# Patient Record
Sex: Male | Born: 1970 | Race: Black or African American | Hispanic: No | Marital: Single | Smoking: Former smoker
Health system: Southern US, Community
[De-identification: ages and names within clinical notes are randomized; demographics above are authoritative.]

## PROBLEM LIST (undated history)

## (undated) DIAGNOSIS — T782XXA Anaphylactic shock, unspecified, initial encounter: Secondary | ICD-10-CM

## (undated) DIAGNOSIS — R059 Cough, unspecified: Secondary | ICD-10-CM

## (undated) DIAGNOSIS — R0602 Shortness of breath: Secondary | ICD-10-CM

## (undated) DIAGNOSIS — K219 Gastro-esophageal reflux disease without esophagitis: Secondary | ICD-10-CM

## (undated) DIAGNOSIS — T7840XA Allergy, unspecified, initial encounter: Secondary | ICD-10-CM

## (undated) DIAGNOSIS — J4540 Moderate persistent asthma, uncomplicated: Secondary | ICD-10-CM

## (undated) DIAGNOSIS — R05 Cough: Secondary | ICD-10-CM

## (undated) HISTORY — DX: Cough, unspecified: R05.9

## (undated) HISTORY — DX: Allergy, unspecified, initial encounter: T78.40XA

## (undated) HISTORY — PX: OTHER SURGICAL HISTORY: SHX169

## (undated) HISTORY — DX: Gastro-esophageal reflux disease without esophagitis: K21.9

## (undated) HISTORY — DX: Shortness of breath: R06.02

## (undated) HISTORY — DX: Moderate persistent asthma, uncomplicated: J45.40

## (undated) HISTORY — DX: Cough: R05

## (undated) HISTORY — DX: Anaphylactic shock, unspecified, initial encounter: T78.2XXA

---

## 1998-08-05 ENCOUNTER — Emergency Department (HOSPITAL_COMMUNITY): Admission: EM | Admit: 1998-08-05 | Discharge: 1998-08-05 | Payer: Self-pay | Admitting: Emergency Medicine

## 1998-08-11 ENCOUNTER — Emergency Department (HOSPITAL_COMMUNITY): Admission: EM | Admit: 1998-08-11 | Discharge: 1998-08-11 | Payer: Self-pay | Admitting: Emergency Medicine

## 2003-06-10 ENCOUNTER — Emergency Department (HOSPITAL_COMMUNITY): Admission: EM | Admit: 2003-06-10 | Discharge: 2003-06-10 | Payer: Self-pay | Admitting: Emergency Medicine

## 2004-08-09 ENCOUNTER — Emergency Department (HOSPITAL_COMMUNITY): Admission: EM | Admit: 2004-08-09 | Discharge: 2004-08-09 | Payer: Self-pay | Admitting: Emergency Medicine

## 2007-10-01 ENCOUNTER — Emergency Department (HOSPITAL_COMMUNITY): Admission: EM | Admit: 2007-10-01 | Discharge: 2007-10-01 | Payer: Self-pay | Admitting: Emergency Medicine

## 2009-08-13 ENCOUNTER — Encounter: Admission: RE | Admit: 2009-08-13 | Discharge: 2009-08-13 | Payer: Self-pay | Admitting: Internal Medicine

## 2011-10-24 ENCOUNTER — Emergency Department (HOSPITAL_COMMUNITY): Payer: No Typology Code available for payment source

## 2011-10-24 ENCOUNTER — Encounter (HOSPITAL_COMMUNITY): Payer: Self-pay | Admitting: Emergency Medicine

## 2011-10-24 ENCOUNTER — Emergency Department (HOSPITAL_COMMUNITY)
Admission: EM | Admit: 2011-10-24 | Discharge: 2011-10-24 | Disposition: A | Discharge status: Home / Self Care | Payer: No Typology Code available for payment source | Attending: Emergency Medicine | Admitting: Emergency Medicine

## 2011-10-24 DIAGNOSIS — R079 Chest pain, unspecified: Secondary | ICD-10-CM | POA: Insufficient documentation

## 2011-10-24 DIAGNOSIS — F17200 Nicotine dependence, unspecified, uncomplicated: Secondary | ICD-10-CM | POA: Insufficient documentation

## 2011-10-24 DIAGNOSIS — R0781 Pleurodynia: Secondary | ICD-10-CM

## 2011-10-24 MED ORDER — OXYCODONE-ACETAMINOPHEN 5-325 MG PO TABS: 1.0000 | ORAL_TABLET | Freq: Four times a day (QID) | ORAL | Status: AC | PRN

## 2011-10-24 MED ORDER — OXYCODONE-ACETAMINOPHEN 5-325 MG PO TABS
2.0000 | ORAL_TABLET | Freq: Once | ORAL | Status: AC
  Administered 2011-10-24: 2 via ORAL
  Filled 2011-10-24: qty 2

## 2011-10-24 MED ORDER — DIAZEPAM 5 MG PO TABS: 5.0000 mg | ORAL_TABLET | Freq: Two times a day (BID) | ORAL | Status: AC

## 2011-10-24 NOTE — ED Provider Notes (Signed)
Medical screening examination/treatment/procedure(s) were performed by non-physician practitioner and as supervising physician I was immediately available for consultation/collaboration.  Doug Sou, MD 10/24/11 1555
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
## 2015-07-02 ENCOUNTER — Emergency Department (HOSPITAL_COMMUNITY)
Admission: EM | Admit: 2015-07-02 | Discharge: 2015-07-03 | Disposition: A | Discharge status: Home / Self Care | Payer: Managed Care, Other (non HMO) | Attending: Emergency Medicine | Admitting: Emergency Medicine

## 2015-07-02 ENCOUNTER — Encounter (HOSPITAL_COMMUNITY): Payer: Self-pay

## 2015-07-02 DIAGNOSIS — Z79899 Other long term (current) drug therapy: Secondary | ICD-10-CM | POA: Diagnosis not present

## 2015-07-02 DIAGNOSIS — Y998 Other external cause status: Secondary | ICD-10-CM | POA: Insufficient documentation

## 2015-07-02 DIAGNOSIS — T63481A Toxic effect of venom of other arthropod, accidental (unintentional), initial encounter: Secondary | ICD-10-CM | POA: Insufficient documentation

## 2015-07-02 DIAGNOSIS — K029 Dental caries, unspecified: Secondary | ICD-10-CM | POA: Diagnosis not present

## 2015-07-02 DIAGNOSIS — Z792 Long term (current) use of antibiotics: Secondary | ICD-10-CM | POA: Insufficient documentation

## 2015-07-02 DIAGNOSIS — F17200 Nicotine dependence, unspecified, uncomplicated: Secondary | ICD-10-CM | POA: Diagnosis not present

## 2015-07-02 DIAGNOSIS — Y9389 Activity, other specified: Secondary | ICD-10-CM | POA: Insufficient documentation

## 2015-07-02 DIAGNOSIS — K002 Abnormalities of size and form of teeth: Secondary | ICD-10-CM | POA: Diagnosis not present

## 2015-07-02 DIAGNOSIS — L509 Urticaria, unspecified: Secondary | ICD-10-CM | POA: Diagnosis not present

## 2015-07-02 DIAGNOSIS — R062 Wheezing: Secondary | ICD-10-CM | POA: Insufficient documentation

## 2015-07-02 DIAGNOSIS — Y9289 Other specified places as the place of occurrence of the external cause: Secondary | ICD-10-CM | POA: Insufficient documentation

## 2015-07-02 LAB — BASIC METABOLIC PANEL
ANION GAP: 11 (ref 5–15)
BUN: 7 mg/dL (ref 6–20)
CO2: 20 mmol/L — ABNORMAL LOW (ref 22–32)
Calcium: 8.2 mg/dL — ABNORMAL LOW (ref 8.9–10.3)
Chloride: 102 mmol/L (ref 101–111)
Creatinine, Ser: 1.01 mg/dL (ref 0.61–1.24)
GFR calc Af Amer: >60 mL/min (ref >60)
GLUCOSE: 104 mg/dL — AB (ref 65–99)
Potassium: 4.1 mmol/L (ref 3.5–5.1)
Sodium: 133 mmol/L — ABNORMAL LOW (ref 135–145)

## 2015-07-02 LAB — CBC WITH DIFFERENTIAL/PLATELET
BASOS ABS: 0 10*3/uL (ref 0.0–0.1)
Basophils Relative: 0 %
Eosinophils Absolute: 0 10*3/uL (ref 0.0–0.7)
Eosinophils Relative: 1 %
HEMATOCRIT: 47.5 % (ref 39.0–52.0)
HEMOGLOBIN: 16.1 g/dL (ref 13.0–17.0)
LYMPHS PCT: 18 %
Lymphs Abs: 0.8 10*3/uL (ref 0.7–4.0)
MCH: 30.9 pg (ref 26.0–34.0)
MCHC: 33.9 g/dL (ref 30.0–36.0)
MCV: 91.2 fL (ref 78.0–100.0)
MONO ABS: 0.5 10*3/uL (ref 0.1–1.0)
Monocytes Relative: 13 %
NEUTROS ABS: 3 10*3/uL (ref 1.7–7.7)
NEUTROS PCT: 68 %
Platelets: 184 10*3/uL (ref 150–400)
RBC: 5.21 MIL/uL (ref 4.22–5.81)
RDW: 14.6 % (ref 11.5–15.5)
WBC: 4.3 10*3/uL (ref 4.0–10.5)

## 2015-07-02 MED ORDER — METHYLPREDNISOLONE SODIUM SUCC 125 MG IJ SOLR
125.0000 mg | Freq: Once | INTRAMUSCULAR | Status: AC
  Administered 2015-07-02: 125 mg via INTRAVENOUS
  Filled 2015-07-02: qty 2

## 2015-07-02 MED ORDER — DIPHENHYDRAMINE HCL 50 MG/ML IJ SOLN
25.0000 mg | Freq: Once | INTRAMUSCULAR | Status: AC
  Administered 2015-07-02: 25 mg via INTRAVENOUS
  Filled 2015-07-02: qty 1

## 2015-07-02 MED ORDER — FAMOTIDINE IN NACL 20-0.9 MG/50ML-% IV SOLN
20.0000 mg | Freq: Once | INTRAVENOUS | Status: AC
  Administered 2015-07-02: 20 mg via INTRAVENOUS
  Filled 2015-07-02: qty 50

## 2015-07-02 MED ORDER — SODIUM CHLORIDE 0.9 % IV BOLUS (SEPSIS)
1000.0000 mL | Freq: Once | INTRAVENOUS | Status: AC
  Administered 2015-07-02: 1000 mL via INTRAVENOUS

## 2015-07-02 NOTE — ED Provider Notes (Signed)
CSN: [id number]     Arrival date & time 07/02/15  2036 History   First MD Initiated Contact with Patient 07/02/15 2127     Chief Complaint  Patient presents with  . Urticaria  . Insect Bite     KEENON LEITZEL is a 45 y.o. male who presents to the ED after a possible insect bite to his right hand. 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 He did not get a good look at what bit him.  He reports he started to notice pain, swelling and itching that is traveling up his arm. He complains of swelling and pain to his hand. He reports he had teeth pulled two days ago and was started on  Penicillin yesterday. He denies any known allergies to any antibiotics. He denies any known exposures or known allergies to insects. He denies feeling short of breath. He has never had a reaction like this before. He denies fevers, trouble swallowing, trouble breathing, The history is provided by the patient. No language interpreter was used.    History reviewed. No pertinent past medical history. History reviewed. No pertinent past surgical history. No family history on file. Social History  Substance Use Topics  . Smoking status: Current Some Day Smoker  . Smokeless tobacco: None  . Alcohol Use: Yes    Review of Systems  Constitutional: Negative for fever and chills.  HENT: Negative for congestion and sore throat.   Eyes: Negative for visual disturbance.  Respiratory: Negative for cough, shortness of breath and wheezing.   Cardiovascular: Negative for chest pain and palpitations.  Gastrointestinal: Negative for nausea, vomiting, abdominal pain and diarrhea.  Genitourinary: Negative for dysuria.  Musculoskeletal: Positive for joint swelling and arthralgias. Negative for back pain and neck pain.  Skin: Positive for rash and wound.  Neurological: Negative for syncope, light-headedness and headaches.       Allergies  Review of patient's allergies indicates no known allergies.  Home Medications   Prior to Admission medications   Medication Sig Start Date End Date Taking? Authorizing Provider  penicillin v potassium (VEETID) 500 MG tablet Take 500 mg by mouth 4 (four) times daily.   Yes Historical Provider, MD  cetirizine (ZYRTEC ALLERGY) 10 MG tablet Take 1 tablet (10 mg total) by mouth daily. 07/03/15   Everlene Farrier, PA-C  diphenhydrAMINE (BENADRYL) 25 MG tablet Take 1 tablet (25 mg total) by mouth every 6 (six) hours as needed for itching (Rash). 07/03/15   Everlene Farrier, PA-C  EPINEPHrine (EPIPEN 2-PAK) 0.3 mg/0.3 mL IJ SOAJ injection Inject 0.3 mLs (0.3 mg total) into the muscle once as needed (for severe allergic reaction). CAll 911 immediately if you have to use this medicine 07/03/15   Everlene Farrier, PA-C  predniSONE (DELTASONE) 20 MG tablet Take 3 tablets (60 mg total) daily for 3 days, then 2 tablets daily (40 mg total) for 3 days, then 1 tablet daily (20 mg total) for 3 days, then 1/2 tablet (10 mg total) daily for 2 days. 07/03/15   Everlene Farrier, PA-C   BP 122/75 mmHg  Pulse 91  Temp(Src) 99.8 F (37.7 C) (Oral)  Resp 16  SpO2 96% Physical Exam  Constitutional: He appears well-developed and well-nourished. No distress.   Nontoxic appearing.  HENT:  Head: Normocephalic and atraumatic.  Mouth/Throat: Oropharynx is clear and moist.   Generally poor dentition. Multiple dental caries. Oropharynx  is clear. Airway is patent.  Eyes: Conjunctivae are normal. Pupils are equal, round, and reactive to light. Right eye exhibits no discharge. Left eye exhibits no discharge.  Neck: Neck supple.  Cardiovascular: Normal rate, regular rhythm, normal heart sounds and intact distal pulses.  Exam reveals no gallop and no friction rub.   No murmur heard.  Heart rate is 96. Bilateral radial pulses are intact. Good capillary refill to his bilateral distal fingertips.  Pulmonary/Chest:  Effort normal. No respiratory distress. He has wheezes. He has no rales.   Slight scattered wheezes noted bilaterally. No increased work of breathing. Oxygen saturation 100% on room air.  Abdominal: Soft. Bowel sounds are normal. There is no tenderness. There is no guarding.  Musculoskeletal: He exhibits no edema.  Lymphadenopathy:    He has no cervical adenopathy.  Neurological: He is alert. Coordination normal.  Skin: Skin is warm and dry. No rash noted. He is not diaphoretic. There is erythema. No pallor.   Patient has erythema and edema noted to his right hand that is going up his arm. Patient has multiple areas of urticaria noted to his bilateral arms and thighs. Please see attached picture for details.  Psychiatric: He has a normal mood and affect. His behavior is normal.  Nursing note and vitals reviewed.       ED Course  Procedures (including critical care time) Labs Review Labs Reviewed  BASIC METABOLIC PANEL - Abnormal; Notable for the following:    Sodium 133 (*)    CO2 20 (*)    Glucose, Bld 104 (*)    Calcium 8.2 (*)    All other components within normal limits  CBC WITH DIFFERENTIAL/PLATELET    Imaging Review No results found. I have personally reviewed and evaluated these lab results as part of my medical decision-making.   EKG Interpretation None      Filed Vitals:   07/02/15 2345 07/03/15 0000 07/03/15 0015 07/03/15 0030  BP: 159/77 147/81 137/64 122/75  Pulse: 94 96 84 91  Temp:      TempSrc:      Resp:      SpO2: 97% 96% 96% 96%     MDM   Meds given in ED:  Medications  sodium chloride 0.9 % bolus 1,000 mL (0 mLs Intravenous Stopped 07/02/15 2317)  methylPREDNISolone sodium succinate (SOLU-MEDROL) 125 mg/2 mL injection 125 mg (125 mg Intravenous Given 07/02/15 2208)  famotidine (PEPCID) IVPB 20 mg premix (0 mg Intravenous Stopped 07/02/15 2244)  diphenhydrAMINE (BENADRYL) injection 25 mg (25 mg Intravenous Given 07/02/15 2208)  diphenhydrAMINE  (BENADRYL) injection 25 mg (25 mg Intravenous Given 07/02/15 2317)    Discharge Medication List as of 07/03/2015 12:52 AM    START taking these medications   Details  cetirizine (ZYRTEC ALLERGY) 10 MG tablet Take 1 tablet (10 mg total) by mouth daily., Starting 07/03/2015, Until Discontinued, Print    diphenhydrAMINE (BENADRYL) 25 MG tablet Take 1 tablet (25 mg total) by mouth every 6 (six) hours as needed for itching (Rash)., Starting 07/03/2015, Until Discontinued, Print    EPINEPHrine (EPIPEN 2-PAK) 0.3 mg/0.3 mL IJ SOAJ injection Inject 0.3 mLs (0.3 mg total) into the muscle once as needed (for severe allergic reaction). CAll 911 immediately if you have to use this medicine, Starting 07/03/2015, Until Discontinued, Print    predniSONE (DELTASONE) 20 MG tablet Take 3 tablets (60 mg total) daily for 3 days, then 2 tablets daily (40 mg total) for 3 days, then 1 tablet daily (20  mg total) for 3 days, then 1/2 tablet (10 mg total) daily for 2 days., Print         Final diagnoses:  Allergic reaction to insect sting, accidental or unintentional, initial encounter   This  is a 45 y.o. male who presents to the ED after a possible insect bite to his right hand. 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 He did not get a good look at what bit him.  He reports he started to notice pain, swelling and itching that is traveling up his arm. He complains of swelling and pain to his hand. He reports he had teeth pulled two days ago and was started on  Penicillin yesterday. He denies any known allergies to any antibiotics.   on exam the patient is afebrile nontoxic appearing. He is having an allergic reaction to this insect bite. Please see picture book for details. Hives to his upper and lower body. He denies any trouble breathing. He has no increased work of breathing. Oxygen saturation is 100% on room air.   patient is provided with Solu-Medrol, Benadryl, and Pepcid. Reevaluation patient reports feeling much better. His swelling has improved. He has had no trouble breathing. Is been monitored for several hours in the emergency department. We'll discharge with a prednisone taper, Benadryl, Zyrtec, and an EpiPen. I provided extensive education on how to use EpiPen and that he needs to call 911 if he uses his EpiPen.  I discussed her concerns specific return precautions. I advised the patient to follow-up with their primary care provider this week. I advised the patient to return to the emergency department with new or worsening symptoms or new concerns. The patient verbalized understanding and agreement with plan.    This patient was discussed with Dr. Lynelle DoctorKnapp who agrees with assessment and plan.   Everlene FarrierWilliam Idania Desouza, PA-C 07/03/15 [id number]  Linwood DibblesJon Knapp, MD 07/03/15 1228

## 2015-07-02 NOTE — ED Notes (Addendum)
Pt here for hives to patients arms and swelling to right hand that he noticed tonight at 8pm. He thinks he may have been bitten by a spider. Denies SOB.

## 2015-07-02 NOTE — ED Notes (Signed)
MD and charge RN aware of patients reaction to spider bite and pt to be taken to next available room.

## 2015-07-03 MED ORDER — DIPHENHYDRAMINE HCL 25 MG PO TABS: 25.0000 mg | ORAL_TABLET | Freq: Four times a day (QID) | ORAL | Status: DC | PRN

## 2015-07-03 MED ORDER — CETIRIZINE HCL 10 MG PO TABS: 10.0000 mg | ORAL_TABLET | Freq: Every day | ORAL | Status: DC

## 2015-07-03 MED ORDER — EPINEPHRINE 0.3 MG/0.3ML IJ SOAJ: 0.3000 mg | Freq: Once | INTRAMUSCULAR | Status: AC | PRN

## 2015-07-03 MED ORDER — PREDNISONE 20 MG PO TABS: ORAL_TABLET | ORAL | Status: DC

## 2015-07-03 NOTE — Discharge Instructions (Signed)
Hives Hives are itchy, red, swollen areas of the skin. They can vary in size and location on your body. Hives can come and go for hours or several days (acute hives) or for several weeks (chronic hives). Hives do not spread from person to person (noncontagious). They may get worse with scratching, exercise, and emotional stress. CAUSES   Allergic reaction to food, additives, or drugs.  Infections, including the common cold.  Illness, such as vasculitis, lupus, or thyroid disease.  Exposure to sunlight, heat, or cold.  Exercise.  Stress.  Contact with chemicals. SYMPTOMS   Red or white swollen patches on the skin. The patches may change size, shape, and location quickly and repeatedly.  Itching.  Swelling of the hands, feet, and face. This may occur if hives develop deeper in the skin. DIAGNOSIS  Your caregiver can usually tell what is wrong by performing a physical exam. Skin or blood tests may also be done to determine the cause of your hives. In some cases, the cause cannot be determined. TREATMENT  Mild cases usually get better with medicines such as antihistamines. Severe cases may require an emergency epinephrine injection. If the cause of your hives is known, treatment includes avoiding that trigger.  HOME CARE INSTRUCTIONS   Avoid causes that trigger your hives.  Take antihistamines as directed by your caregiver to reduce the severity of your hives. Non-sedating or low-sedating antihistamines are usually recommended. Do not drive while taking an antihistamine.  Take any other medicines prescribed for itching as directed by your caregiver.  Wear loose-fitting clothing.  Keep all follow-up appointments as directed by your caregiver. SEEK MEDICAL CARE IF:   You have persistent or severe itching that is not relieved with medicine.  You have painful or swollen joints. SEEK IMMEDIATE MEDICAL CARE IF:   You have a fever.  Your tongue or lips are swollen.  You have  trouble breathing or swallowing.  You feel tightness in the throat or chest.  You have abdominal pain. These problems may be the first sign of a life-threatening allergic reaction. Call your local emergency services (911 in [place] MAKE SURE YOU:   Understand these instructions.  Will watch your condition.  Will get help right away if you are not doing well or get worse.   This information is not intended to replace advice given to you by your health care provider. Make sure you discuss any questions you have with your health care provider.   Document Released: 04/04/2005 Document Revised: 04/09/2013 Document Reviewed: 06/28/2011 Elsevier Interactive Patient Education 2016 [employer]. Epinephrine injection (Auto-injector) What is this medicine? EPINEPHRINE (ep i NEF rin) is used for the emergency treatment of severe allergic reactions. You should keep this medicine with you at all times. This medicine may be used for other purposes; ask your health care provider or pharmacist if you have questions. What should I tell my health care provider before I take this medicine? They need to know if you have any of the following conditions: -diabetes -heart disease -high blood pressure -lung or breathing disease, like asthma -Parkinson's disease -thyroid disease -an unusual or allergic reaction to epinephrine, sulfites, other medicines, foods, dyes, or preservatives -pregnant or trying to get pregnant -breast-feeding How should I use this medicine? This medicine is for injection into the outer thigh. Your doctor or health care professional will instruct you on the proper use of the device during an emergency. Read all directions carefully and make sure you understand them. Do not use  more often than directed. Talk to your pediatrician regarding the use of this medicine in children. Special care may be needed. This drug is commonly used in children. A special device is available for use in  children. If you are giving this medicine to a young child, hold their leg firmly in place before and during the injection to prevent injury. Overdosage: If you think you have taken too much of this medicine contact a poison control center or emergency room at once. NOTE: This medicine is only for you. Do not share this medicine with others. What if I miss a dose? This does not apply. You should only use this medicine for an allergic reaction. What may interact with this medicine? This medicine is only used during an emergency. Significant drug interactions are not likely during emergency use. This list may not describe all possible interactions. Give your health care provider a list of all the medicines, herbs, non-prescription drugs, or dietary supplements you use. Also tell them if you smoke, drink alcohol, or use illegal drugs. Some items may interact with your medicine. What should I watch for while using this medicine? Keep this medicine ready for use in the case of a severe allergic reaction. Make sure that you have the phone number of your doctor or health care professional and local hospital ready. Remember to check the expiration date of your medicine regularly. You may need to have additional units of this medicine with you at work, school, or other places. Talk to your doctor or health care professional about your need for extra units. Some emergencies may require an additional dose. Check with your doctor or a health care professional before using an extra dose. After use, go to the nearest hospital or call 911. Avoid physical activity. Make sure the treating health care professional knows you have received an injection of this medicine. You will receive additional instructions on what to do during and after use of this medicine before a medical emergency occurs. What side effects may I notice from receiving this medicine? Side effects that you should report to your doctor or health care  professional as soon as possible: -allergic reactions like skin rash, itching or hives, swelling of the face, lips, or tongue -breathing problems -chest pain -fast, irregular heartbeat -pain, tingling, numbness in the hands or feet -pain, redness, or irritation at site where injected -vomiting Side effects that usually do not require medical attention (report to your doctor or health care professional if they continue or are bothersome): -anxious -dizziness -dry mouth -headache -increased sweating -nausea -unusually weak or tired This list may not describe all possible side effects. Call your doctor for medical advice about side effects. You may report side effects to FDA at 1-800-FDA-1088. Where should I keep my medicine? Keep out of the reach of children. Store at room temperature between 15 and 30 degrees C (59 and 86 degrees F). Protect from light and heat. The solution should be clear in color. If the solution is discolored or contains particles it must be replaced. Throw away any unused medicine after the expiration date. Ask your doctor or pharmacist about proper disposal of the injector if it is expired or has been used. Always replace your auto-injector before it expires. NOTE: This sheet is a summary. It may not cover all possible information. If you have questions about this medicine, talk to your doctor, pharmacist, or health care provider.    2016, Elsevier/Gold Standard. (2014-09-08 12:24:50)

## 2015-07-03 NOTE — ED Notes (Signed)
Redness & swelling decr'd to upper arms bilaterally. R hand still red & visibly swollen.

## 2015-07-03 NOTE — ED Notes (Signed)
Pt departed in NAD.  

## 2016-08-02 ENCOUNTER — Ambulatory Visit (INDEPENDENT_AMBULATORY_CARE_PROVIDER_SITE_OTHER): Payer: Managed Care, Other (non HMO) | Admitting: Internal Medicine

## 2016-08-02 ENCOUNTER — Encounter: Payer: Self-pay | Admitting: Internal Medicine

## 2016-08-02 VITALS — BP 140/79 | HR 101 | Ht 75.0 in | Wt 207.0 lb

## 2016-08-02 DIAGNOSIS — Z13220 Encounter for screening for lipoid disorders: Secondary | ICD-10-CM | POA: Diagnosis not present

## 2016-08-02 DIAGNOSIS — R9431 Abnormal electrocardiogram [ECG] [EKG]: Secondary | ICD-10-CM | POA: Diagnosis not present

## 2016-08-02 DIAGNOSIS — F17200 Nicotine dependence, unspecified, uncomplicated: Secondary | ICD-10-CM | POA: Diagnosis not present
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
## 2016-08-02 NOTE — Patient Instructions (Signed)
Your physician recommends that you return for lab work FASTING to check cholesterol   Your physician has requested that you have an exercise tolerance test. For further information please visit [email or web address] Please also follow instruction sheet, as given.  Your physician recommends that you schedule a follow-up appointment as needed.    Steps to Quit Smoking Smoking tobacco can be bad for your health. It can also affect almost every organ in your body. Smoking puts you and people around you at risk for many serious long-lasting (chronic) diseases. Quitting smoking is hard, but it is one of the best things that you can do for your health. It is never too late to quit. What are the benefits of quitting smoking? When you quit smoking, you lower your risk for getting serious diseases and conditions. They can include:  Lung cancer or lung disease.  Heart disease.  Stroke.  Heart attack.  Not being able to have children (infertility).  Weak bones (osteoporosis) and broken bones (fractures). If you have coughing, wheezing, and shortness of breath, those symptoms may get better when you quit. You may also get sick less often. If you are pregnant, quitting smoking can help to lower your chances of having a baby of low birth weight. What can I do to help me quit smoking? Talk with your doctor about what can help you quit smoking. Some things you can do (strategies) include:  Quitting smoking totally, instead of slowly cutting back how much you smoke over a period of time.  Going to in-person counseling. You are more likely to quit if you go to many counseling sessions.  Using resources and support systems, such as:  Online chats with a Veterinary surgeon.  Phone quitlines.  Printed Materials engineer.  Support groups or group counseling.  Text messaging programs.  Mobile phone apps or applications.  Taking medicines. Some of these medicines may have nicotine in them. If you are  pregnant or breastfeeding, do not take any medicines to quit smoking unless your doctor says it is okay. Talk with your doctor about counseling or other things that can help you. Talk with your doctor about using more than one strategy at the same time, such as taking medicines while you are also going to in-person counseling. This can help make quitting easier. What things can I do to make it easier to quit? Quitting smoking might feel very hard at first, but there is a lot that you can do to make it easier. Take these steps:  Talk to your family and friends. Ask them to support and encourage you.  Call phone quitlines, reach out to support groups, or work with a Veterinary surgeon.  Ask people who smoke to not smoke around you.  Avoid places that make you want (trigger) to smoke, such as:  Bars.  Parties.  Smoke-break areas at work.  Spend time with people who do not smoke.  Lower the stress in your life. Stress can make you want to smoke. Try these things to help your stress:  Getting regular exercise.  Deep-breathing exercises.  Yoga.  Meditating.  Doing a body scan. To do this, close your eyes, focus on one area of your body at a time from head to toe, and notice which parts of your body are tense. Try to relax the muscles in those areas.  Download or buy apps on your mobile phone or tablet that can help you stick to your quit plan. There are many free apps, such as QuitGuide from  the CDC [employer] for Disease Control and Prevention). You can find more support from smokefree.gov and other websites. This information is not intended to replace advice given to you by your health care provider. Make sure you discuss any questions you have with your health care provider. Document Released: 01/29/2009 Document Revised: 12/01/2015 Document Reviewed: 08/19/2014 Elsevier Interactive Patient Education  2017 [employer].

## 2016-08-10 ENCOUNTER — Inpatient Hospital Stay (HOSPITAL_COMMUNITY): Admission: RE | Admit: 2016-08-10 | Payer: Managed Care, Other (non HMO) | Source: Ambulatory Visit

## 2016-08-12 ENCOUNTER — Telehealth (HOSPITAL_COMMUNITY): Payer: Self-pay

## 2016-08-12 NOTE — Telephone Encounter (Signed)
Encounter complete. 

## 2016-08-17 ENCOUNTER — Ambulatory Visit (HOSPITAL_COMMUNITY)
Admission: RE | Admit: 2016-08-17 | Discharge: 2016-08-17 | Disposition: A | Discharge status: Home / Self Care | Payer: Managed Care, Other (non HMO) | Source: Ambulatory Visit | Attending: Cardiology | Admitting: Cardiology

## 2016-08-17 DIAGNOSIS — F17200 Nicotine dependence, unspecified, uncomplicated: Secondary | ICD-10-CM | POA: Diagnosis not present

## 2016-08-17 DIAGNOSIS — R9431 Abnormal electrocardiogram [ECG] [EKG]: Secondary | ICD-10-CM

## 2016-08-17 LAB — EXERCISE TOLERANCE TEST
CHL RATE OF PERCEIVED EXERTION: 18
CSEPED: 11 min
CSEPEDS: 48 s
CSEPHR: 98 %
Estimated workload: 13.4 METS
MPHR: 174 {beats}/min
Peak HR: 171 {beats}/min
Rest HR: 98 {beats}/min

## 2016-08-19 ENCOUNTER — Encounter: Payer: Self-pay | Admitting: Internal Medicine

## 2016-08-23 ENCOUNTER — Encounter: Payer: Self-pay | Admitting: Internal Medicine

## 2016-08-24 ENCOUNTER — Encounter: Payer: Self-pay | Admitting: *Deleted

## 2016-08-24 ENCOUNTER — Telehealth: Payer: Self-pay | Admitting: Internal Medicine

## 2016-08-24 ENCOUNTER — Telehealth: Payer: Self-pay | Admitting: *Deleted

## 2016-08-24 NOTE — Telephone Encounter (Signed)
Pt is on his way to pick up his papers.

## 2016-08-24 NOTE — Telephone Encounter (Signed)
Unable to reach patient on listed number. Call goes to full VM box - unable to leave msg.

## 2016-08-24 NOTE — Telephone Encounter (Signed)
Returning your call. °

## 2016-08-24 NOTE — Telephone Encounter (Signed)
Spoke to ValeraMargaret at [employer] [facility], pt has come already and picked up his packet.

## 2016-08-24 NOTE — Telephone Encounter (Signed)
-----   Message from Chrystie NoseKenneth C Hilty, MD sent at 08/23/2016  5:21 PM EDT ----- Regarding: RE: return to work Ok to return to work without restrictions. Can amend letter or provide new letter stating this.  Dr. Rexene EdisonH  ----- Message ----- From: Lindell SparElkins, Jenna M, RN Sent: 08/23/2016   5:02 PM To: Chrystie NoseKenneth C Hilty, MD, Ladell HeadsNathan R Uchenna Rappaport, RN, # Subject: return to work                                 Dr. Rennis GoldenHilty - you OK'ed this patient to return to work.. Need to know about any restrictions? He brought by a form from his work that needs this specified. I figured another letter can be generated or this can be denoted on the form and stamped?  Please advise Thanks!

## 2016-08-24 NOTE — Telephone Encounter (Signed)
I have spoken w Nicolas Buchanan and he's aware that his work clearance letter is available for pick-up at the front desk identifying return to work w no restrictions as of 08/22/16. He voiced thanks for help w this matter. Will contact us if stamped signature insufficient for work clearance and aware we can fax  w signature once Nicolas Buchanan back in office.

## 2016-10-20 ENCOUNTER — Telehealth: Payer: Self-pay | Admitting: Internal Medicine

## 2016-10-20 NOTE — Telephone Encounter (Signed)
Received Attending Physicians [facility] for Dr Rennis GoldenHilty to review, complete and sign.  Sent to [employer] @ [facility] for letter/pkt to be sent to patient.  Sent via Courier on 10/20/16. lp

## 2018-03-13 ENCOUNTER — Other Ambulatory Visit: Payer: Self-pay

## 2018-03-13 ENCOUNTER — Emergency Department (HOSPITAL_COMMUNITY): Payer: 59

## 2018-03-13 ENCOUNTER — Observation Stay (HOSPITAL_COMMUNITY)
Admission: EM | Admit: 2018-03-13 | Discharge: 2018-03-14 | Disposition: A | Discharge status: Home / Self Care | Payer: 59 | Attending: Family Medicine | Admitting: Family Medicine

## 2018-03-13 DIAGNOSIS — F17200 Nicotine dependence, unspecified, uncomplicated: Secondary | ICD-10-CM | POA: Diagnosis not present

## 2018-03-13 DIAGNOSIS — Z8249 Family history of ischemic heart disease and other diseases of the circulatory system: Secondary | ICD-10-CM | POA: Diagnosis not present

## 2018-03-13 DIAGNOSIS — R9431 Abnormal electrocardiogram [ECG] [EKG]: Secondary | ICD-10-CM | POA: Diagnosis not present

## 2018-03-13 DIAGNOSIS — Z87828 Personal history of other (healed) physical injury and trauma: Secondary | ICD-10-CM | POA: Insufficient documentation

## 2018-03-13 DIAGNOSIS — Z9109 Other allergy status, other than to drugs and biological substances: Secondary | ICD-10-CM | POA: Insufficient documentation

## 2018-03-13 DIAGNOSIS — R0789 Other chest pain: Principal | ICD-10-CM | POA: Insufficient documentation

## 2018-03-13 DIAGNOSIS — R079 Chest pain, unspecified: Secondary | ICD-10-CM | POA: Diagnosis present

## 2018-03-13 DIAGNOSIS — F17210 Nicotine dependence, cigarettes, uncomplicated: Secondary | ICD-10-CM | POA: Diagnosis not present

## 2018-03-13 DIAGNOSIS — R071 Chest pain on breathing: Secondary | ICD-10-CM | POA: Diagnosis not present

## 2018-03-13 DIAGNOSIS — Z823 Family history of stroke: Secondary | ICD-10-CM | POA: Diagnosis not present

## 2018-03-13 LAB — CBC
HCT: 49.5 % (ref 39.0–52.0)
HCT: 48 % (ref 39.0–52.0)
HEMOGLOBIN: 15.7 g/dL (ref 13.0–17.0)
Hemoglobin: 16.2 g/dL (ref 13.0–17.0)
MCH: 31.6 pg (ref 26.0–34.0)
MCH: 31.3 pg (ref 26.0–34.0)
MCHC: 32.7 g/dL (ref 30.0–36.0)
MCHC: 32.7 g/dL (ref 30.0–36.0)
MCV: 96.5 fL (ref 80.0–100.0)
MCV: 95.6 fL (ref 80.0–100.0)
Platelets: 279 10*3/uL (ref 150–400)
Platelets: 289 10*3/uL (ref 150–400)
RBC: 5.13 MIL/uL (ref 4.22–5.81)
RBC: 5.02 MIL/uL (ref 4.22–5.81)
RDW: 14.6 % (ref 11.5–15.5)
RDW: 14.6 % (ref 11.5–15.5)
WBC: 8.3 10*3/uL (ref 4.0–10.5)
WBC: 8.2 10*3/uL (ref 4.0–10.5)
nRBC: 0 % (ref 0.0–0.2)
nRBC: 0 % (ref 0.0–0.2)

## 2018-03-13 LAB — BASIC METABOLIC PANEL
Anion gap: 5 (ref 5–15)
BUN: 11 mg/dL (ref 6–20)
CO2: 23 mmol/L (ref 22–32)
Calcium: 9.2 mg/dL (ref 8.9–10.3)
Chloride: 108 mmol/L (ref 98–111)
Creatinine, Ser: 1.01 mg/dL (ref 0.61–1.24)
GFR calc Af Amer: >60 mL/min (ref >60)
GFR calc non Af Amer: >60 mL/min (ref >60)
Glucose, Bld: 88 mg/dL (ref 70–99)
Potassium: 4.6 mmol/L (ref 3.5–5.1)
Sodium: 136 mmol/L (ref 135–145)

## 2018-03-13 LAB — I-STAT TROPONIN, ED
Troponin i, poc: 0 ng/mL (ref 0.00–0.08)
Troponin i, poc: 0.01 ng/mL (ref 0.00–0.08)

## 2018-03-13 MED ORDER — IPRATROPIUM-ALBUTEROL 0.5-2.5 (3) MG/3ML IN SOLN
3.0000 mL | Freq: Once | RESPIRATORY_TRACT | Status: AC
  Administered 2018-03-13: 3 mL via RESPIRATORY_TRACT
  Filled 2018-03-13: qty 3

## 2018-03-13 MED ORDER — HEPARIN SODIUM (PORCINE) 5000 UNIT/ML IJ SOLN
5000.0000 [IU] | Freq: Three times a day (TID) | INTRAMUSCULAR | Status: DC
  Administered 2018-03-14 (×2): 5000 [IU] via SUBCUTANEOUS
  Filled 2018-03-13 (×2): qty 1

## 2018-03-13 MED ORDER — ONDANSETRON HCL 4 MG/2ML IJ SOLN: 4.0000 mg | Freq: Four times a day (QID) | INTRAMUSCULAR | Status: DC | PRN

## 2018-03-13 MED ORDER — NICOTINE 21 MG/24HR TD PT24
21.0000 mg | MEDICATED_PATCH | Freq: Every day | TRANSDERMAL | Status: DC
  Administered 2018-03-14: 21 mg via TRANSDERMAL
  Filled 2018-03-13: qty 1

## 2018-03-13 MED ORDER — ACETAMINOPHEN 325 MG PO TABS: 650.0000 mg | ORAL_TABLET | ORAL | Status: DC | PRN

## 2018-03-13 NOTE — ED Triage Notes (Signed)
Pt to ED via EMS for evaluation of chest pain. This morning at 0300 had central to L sided burning chest pain. Has had this problem for 2 years. Pt went outside and got some fresh air and it went away. Multiple episodes of this have happened today with relief from Tums and eating/drinking. At work today he became anxious and tachypnic but has no pain at on arrival with no intervention.

## 2018-03-13 NOTE — ED Notes (Signed)
Patient ambulated, maintaining O2 sat of 98-99%. EDP notified.
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
## 2018-03-14 ENCOUNTER — Other Ambulatory Visit: Payer: Self-pay

## 2018-03-14 ENCOUNTER — Inpatient Hospital Stay (HOSPITAL_COMMUNITY): Payer: 59

## 2018-03-14 ENCOUNTER — Encounter (HOSPITAL_COMMUNITY): Payer: Self-pay | Admitting: General Practice

## 2018-03-14 DIAGNOSIS — R079 Chest pain, unspecified: Secondary | ICD-10-CM | POA: Diagnosis not present

## 2018-03-14 DIAGNOSIS — F17200 Nicotine dependence, unspecified, uncomplicated: Secondary | ICD-10-CM | POA: Diagnosis not present

## 2018-03-14 DIAGNOSIS — R9431 Abnormal electrocardiogram [ECG] [EKG]: Secondary | ICD-10-CM | POA: Diagnosis not present

## 2018-03-14 LAB — CREATININE, SERUM
CREATININE: 1.06 mg/dL (ref 0.61–1.24)
GFR calc Af Amer: >60 mL/min (ref >60)
GFR calc non Af Amer: >60 mL/min (ref >60)

## 2018-03-14 LAB — TROPONIN I

## 2018-03-14 LAB — HIV ANTIBODY (ROUTINE TESTING W REFLEX): HIV SCREEN 4TH GENERATION: NONREACTIVE

## 2018-03-14 MED ORDER — IPRATROPIUM-ALBUTEROL 0.5-2.5 (3) MG/3ML IN SOLN
3.0000 mL | RESPIRATORY_TRACT | Status: DC | PRN
  Administered 2018-03-14: 3 mL via RESPIRATORY_TRACT
  Filled 2018-03-14: qty 3

## 2018-03-14 MED ORDER — PANTOPRAZOLE SODIUM 40 MG PO TBEC: 40.0000 mg | DELAYED_RELEASE_TABLET | Freq: Every day | ORAL | 0 refills | Status: DC

## 2018-03-14 NOTE — Discharge Summary (Signed)
Physician Discharge Summary  Lolita RiegerMilton F Viera ZOX:[id number] DOB: [birth year] DOA: 03/13/2018  PCP: Patient, No Pcp Per  Admit date: 03/13/2018 Discharge date: 03/14/2018  Admitted From: Home Disposition: Home   Recommendations for Outpatient Follow-up:  1. Establish care with PCP as soon as possible. Pt to find providers accepting [employer]. 2. Continued smoking cessation counseling and efforts.  Home Health: None Equipment/Devices: None Discharge Condition: Stable CODE STATUS: Full Diet recommendation: Heart healthy  Brief/Interim Summary: Lolita RiegerMilton F Huxtable is a 47 y.o. male with a history of tobacco use and abnormal ECG with normal stress testing 2018 who presented with acute on chronic chest pain. He reports intermittent sensations of discomfort in the upper abdomen and left chest that is not exertional and improved with belching, lasting minutes to hours. He describes a burning sensation there as well that can occur at any time of day, but often at night. Usually this is not associated with difficulty breathing, though he presented this time because of that associated. He was worked up in the ED and symptoms were relieved with nitroglycerin and morphine. ECG showed nonspecific t wave changes, inversions in inferior leads stable from prior and findings suggestive of LVH. He was placed in observation and has not had recurrence of chest pain or dyspnea despite walking freely. Troponins have remained undetectable x5. CBC and BMP entirely normal.   Discharge Diagnoses:  Principal Problem:   Chest pain Active Problems:   Abnormal EKG   Smoker  Chest pain: Had a normal exercise stress test in the past year and characteristics of pain seem more consistent with GI etiology, e.g. esophageal spasm vs. reflux.  - ACS ruled out with serial cardiac enzymes and no change in ECG. Pain resolved.  - Trial PPI and follow up closely with PCP  Tobacco use:  - counseled extensively to quit, declines any  medication at this time, will discuss with new PCP. Motivated to stop.   Discharge Instructions Discharge Instructions    Diet - low sodium heart healthy   Complete by:  As directed    Discharge instructions   Complete by:  As directed    You were evaluated for chest discomfort that is not likely to be due to your heart. It is possibly related to reflux. You should take protonix daily and follow up with a primary care provider as soon as possible. Seek medical attention if you develop worse chest pain or a change in the type of pain and trouble breathing. It is critical that you stop smoking. You can continue discussing additional options to help with this when you follow up.   Increase activity slowly   Complete by:  As directed      Allergies as of 03/14/2018      Reactions   Other Swelling   Spider Bite      Medication List    TAKE these medications   EPINEPHrine 0.3 mg/0.3 mL Soaj injection Commonly known as:  EPI-PEN Inject 0.3 mLs (0.3 mg total) into the muscle once as needed (for severe allergic reaction). CAll 911 immediately if you have to use this medicine   pantoprazole 40 MG tablet Commonly known as:  PROTONIX Take 1 tablet (40 mg total) by mouth daily.      Follow-up Information    [facility] Follow up.   Specialty:  Internal Medicine Why:  accepts [employer], please call after March 18, 2018 to arrange new patient appointment Contact information: [address] [place] [address] [phone]         Allergies  Allergen Reactions  . Other Swelling    Spider Bite    Consultations:  None  Procedures/Studies: Dg Chest 2 View  Result Date: 03/13/2018 CLINICAL DATA:  Chest pain EXAM: CHEST - 2 VIEW COMPARISON:  10/24/2011 FINDINGS: The heart size and mediastinal contours are within normal limits. Both lungs are clear. Degenerative changes of the spine. IMPRESSION: No active cardiopulmonary disease. Electronically  Signed   By: Jasmine Pang M.D.   On: 03/13/2018 17:55   Subjective: No chest pain, dyspnea. Asked if he smokes and he said "I quit yesterday." Wants to go home.  Discharge Exam: Vitals:   03/14/18 0624 03/14/18 1001  BP: 117/78 114/79  Pulse: 81 73  Resp: 20 20  Temp: 98.2 F (36.8 C) 98.1 F (36.7 C)  SpO2: 98% 94%   General: Pt is alert, awake, not in acute distress Cardiovascular: RRR, S1/S2 +, no rubs, no gallops Respiratory: CTA bilaterally, no wheezing, no rhonchi Abdominal: Soft, NT, ND, bowel sounds + Extremities: No edema, no cyanosis  Labs: BNP (last 3 results) No results for input(s): BNP in the last 8760 hours. Basic Metabolic Panel: Recent Labs  Lab 03/13/18 1702 03/13/18 2232  NA 136  --   K 4.6  --   CL 108  --   CO2 23  --   GLUCOSE 88  --   BUN 11  --   CREATININE 1.01 1.06  CALCIUM 9.2  --    Liver Function Tests: No results for input(s): AST, ALT, ALKPHOS, BILITOT, PROT, ALBUMIN in the last 168 hours. No results for input(s): LIPASE, AMYLASE in the last 168 hours. No results for input(s): AMMONIA in the last 168 hours. CBC: Recent Labs  Lab 03/13/18 1702 03/13/18 2232  WBC 8.3 8.2  HGB 16.2 15.7  HCT 49.5 48.0  MCV 96.5 95.6  PLT 279 289   Cardiac Enzymes: Recent Labs  Lab 03/13/18 2232 03/14/18 0104 03/14/18 0400  TROPONINI <0.03 <0.03 <0.03   BNP: Invalid input(s): POCBNP CBG: No results for input(s): GLUCAP in the last 168 hours. D-Dimer No results for input(s): DDIMER in the last 72 hours. Hgb A1c No results for input(s): HGBA1C in the last 72 hours. Lipid Profile No results for input(s): CHOL, HDL, LDLCALC, TRIG, CHOLHDL, LDLDIRECT in the last 72 hours. Thyroid function studies No results for input(s): TSH, T4TOTAL, T3FREE, THYROIDAB in the last 72 hours.  Invalid input(s): FREET3 Anemia work up No results for input(s): VITAMINB12, FOLATE, FERRITIN, TIBC, IRON, RETICCTPCT in the last 72 hours. Urinalysis No  results found for: COLORURINE, APPEARANCEUR, LABSPEC, PHURINE, GLUCOSEU, HGBUR, BILIRUBINUR, KETONESUR, PROTEINUR, UROBILINOGEN, NITRITE, LEUKOCYTESUR  Microbiology No results found for this or any previous visit (from the past 240 hour(s)).  Time coordinating discharge: Approximately 40 minutes  Tyrone Nine, MD  [facility] 03/14/2018, 12:53 PM Pager [phone]

## 2018-03-14 NOTE — Progress Notes (Signed)
Pt has orders to be discharged. Discharge instructions given and pt has no additional questions at this time. Medication regimen reviewed and pt educated. Pt verbalized understanding and has no additional questions. Telemetry box removed. IV removed and site in good condition. Pt stable and waiting for transportation. 

## 2018-03-14 NOTE — Care Management Note (Signed)
Case Management Note  Patient Details  Name: Nicolas RiegerMilton F Armond MRN: [id number] Date of Birth: [birth year]  Subjective/Objective:   Chest pain, smoker                 Action/Plan: NCM spoke to pt and states he will contact [employer] for list of providers and arrange follow up appt. He works full-time. And plans to stop smoking.   Expected Discharge Date:  03/14/18               Expected Discharge Plan:  Home/Self Care  In-House Referral:  NA  Discharge planning Services  CM Consult  Post Acute Care Choice:  NA Choice offered to:  NA  DME Arranged:  N/A DME Agency:  NA  HH Arranged:  NA HH Agency:  NA  Status of Service:  Completed, signed off  If discussed at Long Length of Stay Meetings, dates discussed:    Additional Comments:  Elliot CousinShavis, Rai Severns Ellen, RN 03/14/2018, 1:44 PM

## 2018-03-23 ENCOUNTER — Emergency Department (HOSPITAL_COMMUNITY)
Admission: EM | Admit: 2018-03-23 | Discharge: 2018-03-23 | Disposition: A | Discharge status: Home / Self Care | Payer: 59 | Attending: Emergency Medicine | Admitting: Emergency Medicine

## 2018-03-23 ENCOUNTER — Emergency Department (HOSPITAL_COMMUNITY): Payer: 59

## 2018-03-23 ENCOUNTER — Encounter (HOSPITAL_COMMUNITY): Payer: Self-pay | Admitting: Emergency Medicine

## 2018-03-23 DIAGNOSIS — J9801 Acute bronchospasm: Secondary | ICD-10-CM | POA: Insufficient documentation

## 2018-03-23 DIAGNOSIS — Z79899 Other long term (current) drug therapy: Secondary | ICD-10-CM | POA: Diagnosis not present

## 2018-03-23 DIAGNOSIS — R0602 Shortness of breath: Secondary | ICD-10-CM | POA: Diagnosis present

## 2018-03-23 DIAGNOSIS — Z87891 Personal history of nicotine dependence: Secondary | ICD-10-CM | POA: Diagnosis not present

## 2018-03-23 LAB — CBC WITH DIFFERENTIAL/PLATELET
Abs Immature Granulocytes: 0.01 10*3/uL (ref 0.00–0.07)
Basophils Absolute: 0 10*3/uL (ref 0.0–0.1)
Basophils Relative: 0 %
EOS PCT: 7 %
Eosinophils Absolute: 0.5 10*3/uL (ref 0.0–0.5)
HCT: 48.4 % (ref 39.0–52.0)
Hemoglobin: 16.3 g/dL (ref 13.0–17.0)
Immature Granulocytes: 0 %
Lymphocytes Relative: 24 %
Lymphs Abs: 1.9 10*3/uL (ref 0.7–4.0)
MCH: 31.8 pg (ref 26.0–34.0)
MCHC: 33.7 g/dL (ref 30.0–36.0)
MCV: 94.5 fL (ref 80.0–100.0)
MONOS PCT: 16 %
Monocytes Absolute: 1.3 10*3/uL — ABNORMAL HIGH (ref 0.1–1.0)
Neutro Abs: 4.2 10*3/uL (ref 1.7–7.7)
Neutrophils Relative %: 53 %
Platelets: 284 10*3/uL (ref 150–400)
RBC: 5.12 MIL/uL (ref 4.22–5.81)
RDW: 13.9 % (ref 11.5–15.5)
WBC: 7.9 10*3/uL (ref 4.0–10.5)
nRBC: 0 % (ref 0.0–0.2)

## 2018-03-23 LAB — BASIC METABOLIC PANEL
Anion gap: 11 (ref 5–15)
BUN: 11 mg/dL (ref 6–20)
CO2: 21 mmol/L — AB (ref 22–32)
Calcium: 8.9 mg/dL (ref 8.9–10.3)
Chloride: 102 mmol/L (ref 98–111)
Creatinine, Ser: 1.03 mg/dL (ref 0.61–1.24)
GFR calc Af Amer: >60 mL/min (ref >60)
GFR calc non Af Amer: >60 mL/min (ref >60)
Glucose, Bld: 108 mg/dL — ABNORMAL HIGH (ref 70–99)
Potassium: 3.5 mmol/L (ref 3.5–5.1)
Sodium: 134 mmol/L — ABNORMAL LOW (ref 135–145)

## 2018-03-23 LAB — I-STAT TROPONIN, ED: Troponin i, poc: 0 ng/mL (ref 0.00–0.08)

## 2018-03-23 MED ORDER — PREDNISONE 10 MG PO TABS: 20.0000 mg | ORAL_TABLET | Freq: Every day | ORAL | 0 refills | Status: DC

## 2018-03-23 MED ORDER — ALBUTEROL SULFATE HFA 108 (90 BASE) MCG/ACT IN AERS: 1.0000 | INHALATION_SPRAY | Freq: Four times a day (QID) | RESPIRATORY_TRACT | 0 refills | Status: DC | PRN

## 2018-03-23 NOTE — Discharge Instructions (Signed)
Please return for any problem.  Follow-up at your previously scheduled appointment next week with your primary care provider.

## 2018-03-23 NOTE — ED Triage Notes (Addendum)
Patient here via EMS from home with complaints of SOB that started this morning when waking. Smoker. Albuterol, SoluMedrol given in route. Reports coughing x3 days with brown sputum.

## 2018-03-23 NOTE — ED Notes (Signed)
Bed: [id number] Expected date:  Expected time:  Means of arrival:  Comments: EMS 47 yo male from home resp distress/wheezing/duoneb/Solumedrol 125 mg IV
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
## 2018-03-28 ENCOUNTER — Encounter: Payer: Self-pay | Admitting: Family Medicine

## 2018-03-28 ENCOUNTER — Ambulatory Visit (INDEPENDENT_AMBULATORY_CARE_PROVIDER_SITE_OTHER): Payer: 59 | Admitting: Family Medicine

## 2018-03-28 ENCOUNTER — Telehealth: Payer: Self-pay

## 2018-03-28 VITALS — BP 116/68 | HR 68 | Temp 98.8°F | Ht 75.0 in | Wt 214.0 lb

## 2018-03-28 DIAGNOSIS — R062 Wheezing: Secondary | ICD-10-CM

## 2018-03-28 DIAGNOSIS — R0602 Shortness of breath: Secondary | ICD-10-CM | POA: Diagnosis not present

## 2018-03-28 DIAGNOSIS — F17200 Nicotine dependence, unspecified, uncomplicated: Secondary | ICD-10-CM

## 2018-03-28 DIAGNOSIS — J4540 Moderate persistent asthma, uncomplicated: Secondary | ICD-10-CM

## 2018-03-28 DIAGNOSIS — Z23 Encounter for immunization: Secondary | ICD-10-CM | POA: Diagnosis not present

## 2018-03-28 DIAGNOSIS — Z716 Tobacco abuse counseling: Secondary | ICD-10-CM

## 2018-03-28 DIAGNOSIS — Z0000 Encounter for general adult medical examination without abnormal findings: Secondary | ICD-10-CM

## 2018-03-28 DIAGNOSIS — Z131 Encounter for screening for diabetes mellitus: Secondary | ICD-10-CM

## 2018-03-28 DIAGNOSIS — Z09 Encounter for follow-up examination after completed treatment for conditions other than malignant neoplasm: Secondary | ICD-10-CM

## 2018-03-28 DIAGNOSIS — K219 Gastro-esophageal reflux disease without esophagitis: Secondary | ICD-10-CM

## 2018-03-28 LAB — POCT URINALYSIS DIP (MANUAL ENTRY)
Bilirubin, UA: NEGATIVE
Blood, UA: NEGATIVE
Glucose, UA: NEGATIVE mg/dL
Ketones, POC UA: NEGATIVE mg/dL
Nitrite, UA: NEGATIVE
Protein Ur, POC: NEGATIVE mg/dL
Spec Grav, UA: 1.015 (ref 1.010–1.025)
Urobilinogen, UA: 0.2 E.U./dL
pH, UA: 5.5 (ref 5.0–8.0)

## 2018-03-28 LAB — POCT GLYCOSYLATED HEMOGLOBIN (HGB A1C): Hemoglobin A1C: 5 % (ref 4.0–5.6)

## 2018-03-28 MED ORDER — PANTOPRAZOLE SODIUM 40 MG PO TBEC: 40.0000 mg | DELAYED_RELEASE_TABLET | Freq: Every day | ORAL | 6 refills | Status: DC

## 2018-03-28 MED ORDER — ALBUTEROL SULFATE HFA 108 (90 BASE) MCG/ACT IN AERS: 1.0000 | INHALATION_SPRAY | Freq: Four times a day (QID) | RESPIRATORY_TRACT | 12 refills | Status: AC | PRN

## 2018-03-28 MED ORDER — BUDESONIDE-FORMOTEROL FUMARATE 80-4.5 MCG/ACT IN AERO: 2.0000 | INHALATION_SPRAY | Freq: Two times a day (BID) | RESPIRATORY_TRACT | 12 refills | Status: AC

## 2018-03-28 MED ORDER — ALBUTEROL SULFATE (2.5 MG/3ML) 0.083% IN NEBU: 2.5000 mg | INHALATION_SOLUTION | Freq: Four times a day (QID) | RESPIRATORY_TRACT | 12 refills | Status: AC | PRN
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
## 2018-03-28 NOTE — Patient Instructions (Addendum)
Albuterol inhalation aerosol What is this medicine? ALBUTEROL (al BYOO ter ole) is a bronchodilator. It helps open up the airways in your lungs to make it easier to breathe. This medicine is used to treat and to prevent bronchospasm. This medicine may be used for other purposes; ask your health care provider or pharmacist if you have questions. COMMON BRAND NAME(S): Proair HFA, Proventil, Proventil HFA, Respirol, Ventolin, Ventolin HFA What should I tell my health care provider before I take this medicine? They need to know if you have any of the following conditions: -diabetes -heart disease or irregular heartbeat -high blood pressure -pheochromocytoma -seizures -thyroid disease -an unusual or allergic reaction to albuterol, levalbuterol, sulfites, other medicines, foods, dyes, or preservatives -pregnant or trying to get pregnant -breast-feeding How should I use this medicine? This medicine is for inhalation through the mouth. Follow the directions on your prescription label. Take your medicine at regular intervals. Do not use more often than directed. Make sure that you are using your inhaler correctly. Ask you doctor or health care provider if you have any questions. Talk to your pediatrician regarding the use of this medicine in children. Special care may be needed. Overdosage: If you think you have taken too much of this medicine contact a poison control center or emergency room at once. NOTE: This medicine is only for you. Do not share this medicine with others. What if I miss a dose? If you miss a dose, use it as soon as you can. If it is almost time for your next dose, use only that dose. Do not use double or extra doses. What may interact with this medicine? -anti-infectives like chloroquine and pentamidine -caffeine -cisapride -diuretics -medicines for colds -medicines for depression or for emotional or psychotic conditions -medicines for weight loss including some herbal  products -methadone -some antibiotics like clarithromycin, erythromycin, levofloxacin, and linezolid -some heart medicines -steroid hormones like dexamethasone, cortisone, hydrocortisone -theophylline -thyroid hormones This list may not describe all possible interactions. Give your health care provider a list of all the medicines, herbs, non-prescription drugs, or dietary supplements you use. Also tell them if you smoke, drink alcohol, or use illegal drugs. Some items may interact with your medicine. What should I watch for while using this medicine? Tell your doctor or health care professional if your symptoms do not improve. Do not use extra albuterol. If your asthma or bronchitis gets worse while you are using this medicine, call your doctor right away. If your mouth gets dry try chewing sugarless gum or sucking hard candy. Drink water as directed. What side effects may I notice from receiving this medicine? Side effects that you should report to your doctor or health care professional as soon as possible: -allergic reactions like skin rash, itching or hives, swelling of the face, lips, or tongue -breathing problems -chest pain -feeling faint or lightheaded, falls -high blood pressure -irregular heartbeat -fever -muscle cramps or weakness -pain, tingling, numbness in the hands or feet -vomiting Side effects that usually do not require medical attention (report to your doctor or health care professional if they continue or are bothersome): -cough -difficulty sleeping -headache -nervousness or trembling -stomach upset -stuffy or runny nose -throat irritation -unusual taste This list may not describe all possible side effects. Call your doctor for medical advice about side effects. You may report side effects to FDA at 1-800-FDA-1088. Where should I keep my medicine? Keep out of the reach of children. Store at room temperature between 15 and 30 degrees   C (59 and 86 degrees F). The  contents are under pressure and may burst when exposed to heat or flame. Do not freeze. This medicine does not work as well if it is too cold. Throw away any unused medicine after the expiration date. Inhalers need to be thrown away after the labeled number of puffs have been used or by the expiration date; whichever comes first. Ventolin HFA should be thrown away 12 months after removing from foil pouch. Check the instructions that come with your medicine. NOTE: This sheet is a summary. It may not cover all possible information. If you have questions about this medicine, talk to your doctor, pharmacist, or health care provider.  2018 Elsevier/Gold Standard (2012-09-20 10:57:17)      Budesonide; Formoterol Inhalation What is this medicine? BUDESONIDE; FORMOTEROL (byoo DES oh nide; for MOH te rol) inhalation is a combination of 2 medicines that decrease inflammation and help to open up the airways in your lungs. It is used to treat asthma. It is also used to treat chronic obstructive pulmonary disease (COPD), including chronic bronchitis or emphysema. Do NOT use for an acute asthma or COPD attack. This medicine may be used for other purposes; ask your health care provider or pharmacist if you have questions. COMMON BRAND NAME(S): Symbicort What should I tell my health care provider before I take this medicine? They need to know if you have any of these conditions: -bone problems -diabetes -heart disease or irregular heartbeat -high blood pressure -immune system problems -infection -liver disease -worsening asthma -an unusual or allergic reaction to budesonide, formoterol, medicines, foods, dyes, or preservatives -pregnant or trying to get pregnant -breast-feeding How should I use this medicine? This medicine is inhaled through the mouth. Rinse your mouth with water after use. Make sure not to swallow the water. Follow the directions on your prescription label. Do not use more often than  directed. Do not stop taking except on your doctor's advice. Make sure that you are using your inhaler correctly. Ask your doctor or health care provider if you have any questions. A special MedGuide will be given to you by the pharmacist with each prescription and refill. Be sure to read this information carefully each time. Talk to your pediatrician regarding the use of this medicine in children. While this drug may be prescribed for children as young as 126 years of age for selected conditions, precautions do apply. Overdosage: If you think you have taken too much of this medicine contact a poison control center or emergency room at once. NOTE: This medicine is only for you. Do not share this medicine with others. What if I miss a dose? If you miss a dose, use it as soon as you remember. If it is almost time for your next dose, use only that dose and continue with your regular schedule, spacing doses evenly. Do not use double or extra doses. What may interact with this medicine? Do not take this medicine with any of the following medications: -MAOIs like Carbex, Eldepryl, Marplan, Nardil, and Parnate -mifepristone -probucol -procarbazine -some other medicines for asthma like formoterol, salmeterol This medicine may also interact with the following medications: -antibiotics like clarithromycin, erythromycin -cimetidine -diuretics -grapefruit juice -itraconazole -ketoconazole -medicines for depression, anxiety, or psychotic disturbances -medicines for irregular heartbeat -methadone -some heart medicines like atenolol, metoprolol -some other medicines for breathing problems -some vaccines This list may not describe all possible interactions. Give your health care provider a list of all the medicines, herbs, non-prescription drugs,  or dietary supplements you use. Also tell them if you smoke, drink alcohol, or use illegal drugs. Some items may interact with your medicine. What should I watch  for while using this medicine? Tell your doctor or health care professional if your symptoms do not improve or get worse. Do not use this medicine more than every 12 hours. NEVER use this medicine for an acute asthma or COPD attack. You should use your short-acting rescue inhalers for this purpose. If your symptoms get worse or if you need your short-acting inhalers more often, call your doctor right away. This medicine may increase your risk of getting an infection. Tell your doctor or health care professional if you are around anyone with measles or chickenpox, or if you develop sores or blisters that do not heal properly. What side effects may I notice from receiving this medicine? Side effects that you should report to your doctor or health care professional as soon as possible: -allergic reactions such as skin rash or itching, hives, swelling of the face, lips or tongue -breathing problems -changes in vision -chest pain -fast, irregular heartbeat -feeling faint or lightheaded, falls -fever -high blood pressure -nervousness -tremors -white patches or sores in mouth Side effects that usually do not require medical attention (report to your doctor or health care professional if they continue or are bothersome): -cough -different taste in mouth -headache -sore throat -stuffy nose -stomach upset This list may not describe all possible side effects. Call your doctor for medical advice about side effects. You may report side effects to FDA at 1-800-FDA-1088. Where should I keep my medicine? Keep out of the reach of children. Store in a dry place at room temperature between 20 and 25 degrees C (68 and 77 degrees F). Do not get the inhaler wet. Keep track of the number of doses used. Throw away the inhaler after using the marked number of inhalations or after the expiration date, whichever comes first. Do not burn or puncture canister. NOTE: This sheet is a summary. It may not cover all  possible information. If you have questions about this medicine, talk to your doctor, pharmacist, or health care provider.  2018 Elsevier/Gold Standard (2016-04-07 15:25:18)    Pantoprazole tablets What is this medicine? PANTOPRAZOLE (pan TOE pra zole) prevents the production of acid in the stomach. It is used to treat gastroesophageal reflux disease (GERD), inflammation of the esophagus, and Zollinger-Ellison syndrome. This medicine may be used for other purposes; ask your health care provider or pharmacist if you have questions. COMMON BRAND NAME(S): Protonix What should I tell my health care provider before I take this medicine? They need to know if you have any of these conditions: -liver disease -low levels of magnesium in the blood -lupus -an unusual or allergic reaction to omeprazole, lansoprazole, pantoprazole, rabeprazole, other medicines, foods, dyes, or preservatives -pregnant or trying to get pregnant -breast-feeding How should I use this medicine? Take this medicine by mouth. Swallow the tablets whole with a drink of water. Follow the directions on the prescription label. Do not crush, break, or chew. Take your medicine at regular intervals. Do not take your medicine more often than directed. Talk to your pediatrician regarding the use of this medicine in children. While this drug may be prescribed for children as young as 5 years for selected conditions, precautions do apply. Overdosage: If you think you have taken too much of this medicine contact a poison control center or emergency room at once. NOTE: This medicine is  only for you. Do not share this medicine with others. What if I miss a dose? If you miss a dose, take it as soon as you can. If it is almost time for your next dose, take only that dose. Do not take double or extra doses. What may interact with this medicine? Do not take this medicine with any of the following medications: -atazanavir -nelfinavir This  medicine may also interact with the following medications: -ampicillin -delavirdine -erlotinib -iron salts -medicines for fungal infections like ketoconazole, itraconazole and voriconazole -methotrexate -mycophenolate mofetil -warfarin This list may not describe all possible interactions. Give your health care provider a list of all the medicines, herbs, non-prescription drugs, or dietary supplements you use. Also tell them if you smoke, drink alcohol, or use illegal drugs. Some items may interact with your medicine. What should I watch for while using this medicine? It can take several days before your stomach pain gets better. Check with your doctor or health care professional if your condition does not start to get better, or if it gets worse. You may need blood work done while you are taking this medicine. What side effects may I notice from receiving this medicine? Side effects that you should report to your doctor or health care professional as soon as possible: -allergic reactions like skin rash, itching or hives, swelling of the face, lips, or tongue -bone, muscle or joint pain -breathing problems -chest pain or chest tightness -dark yellow or brown urine -dizziness -fast, irregular heartbeat -feeling faint or lightheaded -fever or sore throat -muscle spasm -palpitations -rash on cheeks or arms that gets worse in the sun -redness, blistering, peeling or loosening of the skin, including inside the mouth -seizures -tremors -unusual bleeding or bruising -unusually weak or tired -yellowing of the eyes or skin Side effects that usually do not require medical attention (report to your doctor or health care professional if they continue or are bothersome): -constipation -diarrhea -dry mouth -headache -nausea This list may not describe all possible side effects. Call your doctor for medical advice about side effects. You may report side effects to FDA at 1-800-FDA-1088. Where  should I keep my medicine? Keep out of the reach of children. Store at room temperature between 15 and 30 degrees C (59 and 86 degrees F). Protect from light and moisture. Throw away any unused medicine after the expiration date. NOTE: This sheet is a summary. It may not cover all possible information. If you have questions about this medicine, talk to your doctor, pharmacist, or health care provider.  2018 Elsevier/Gold Standard (2015-05-07 12:20:19)

## 2018-03-29 LAB — LIPID PANEL
Chol/HDL Ratio: 2.7 ratio (ref 0.0–5.0)
Cholesterol, Total: 178 mg/dL (ref 100–199)
HDL: 66 mg/dL (ref >39)
LDL Calculated: 94 mg/dL (ref 0–99)
Triglycerides: 90 mg/dL (ref 0–149)
VLDL Cholesterol Cal: 18 mg/dL (ref 5–40)

## 2018-03-29 LAB — COMPREHENSIVE METABOLIC PANEL
ALT: 11 IU/L (ref 0–44)
AST: 13 IU/L (ref 0–40)
Albumin/Globulin Ratio: 1.9 (ref 1.2–2.2)
Albumin: 3.9 g/dL (ref 3.5–5.5)
Alkaline Phosphatase: 69 IU/L (ref 39–117)
BUN/Creatinine Ratio: 9 (ref 9–20)
BUN: 9 mg/dL (ref 6–24)
Bilirubin Total: 0.7 mg/dL (ref 0.0–1.2)
CO2: 21 mmol/L (ref 20–29)
Calcium: 9.2 mg/dL (ref 8.7–10.2)
Chloride: 106 mmol/L (ref 96–106)
Creatinine, Ser: 1 mg/dL (ref 0.76–1.27)
GFR calc Af Amer: 102 mL/min/{1.73_m2} (ref >59)
GFR calc non Af Amer: 89 mL/min/{1.73_m2} (ref >59)
Globulin, Total: 2.1 g/dL (ref 1.5–4.5)
Glucose: 87 mg/dL (ref 65–99)
Potassium: 4.4 mmol/L (ref 3.5–5.2)
Sodium: 141 mmol/L (ref 134–144)
Total Protein: 6 g/dL (ref 6.0–8.5)

## 2018-03-29 LAB — TSH: TSH: 2.86 u[IU]/mL (ref 0.450–4.500)

## 2018-03-29 LAB — PSA: Prostate Specific Ag, Serum: 0.7 ng/mL (ref 0.0–4.0)

## 2018-03-29 NOTE — Telephone Encounter (Signed)
Patient needs a order for a nebulizer machine. Patient is aware that you are out of office until tomorrow.

## 2018-04-03 ENCOUNTER — Other Ambulatory Visit: Payer: Self-pay | Admitting: Family Medicine

## 2018-04-03 DIAGNOSIS — J4540 Moderate persistent asthma, uncomplicated: Secondary | ICD-10-CM

## 2018-04-03 DIAGNOSIS — R0602 Shortness of breath: Secondary | ICD-10-CM

## 2018-04-03 DIAGNOSIS — R062 Wheezing: Secondary | ICD-10-CM

## 2018-04-03 MED ORDER — NEBULIZER/TUBING/MOUTHPIECE KIT: 1.0000 [IU] | PACK | 0 refills | Status: DC | PRN

## 2018-04-03 NOTE — Telephone Encounter (Signed)
New Rx for Nebulizer Kit sent to pharmacy today. Please inform patient.

## 2018-04-05 ENCOUNTER — Other Ambulatory Visit: Payer: Self-pay

## 2018-04-06 ENCOUNTER — Other Ambulatory Visit: Payer: Self-pay

## 2018-04-06 ENCOUNTER — Other Ambulatory Visit: Payer: Self-pay | Admitting: Family Medicine

## 2018-04-06 DIAGNOSIS — R062 Wheezing: Secondary | ICD-10-CM

## 2018-04-06 DIAGNOSIS — R0602 Shortness of breath: Secondary | ICD-10-CM

## 2018-04-06 DIAGNOSIS — J4540 Moderate persistent asthma, uncomplicated: Secondary | ICD-10-CM

## 2018-04-06 MED ORDER — NEBULIZER/TUBING/MOUTHPIECE KIT: 1.0000 [IU] | PACK | 0 refills | Status: AC | PRN

## 2018-04-06 MED ORDER — NEBULIZER COMPRESSOR KIT: 1.0000 | PACK | 0 refills | Status: AC | PRN

## 2018-04-09 ENCOUNTER — Telehealth: Payer: Self-pay

## 2018-04-23 ENCOUNTER — Other Ambulatory Visit: Payer: Self-pay

## 2018-04-30 ENCOUNTER — Encounter: Payer: Self-pay | Admitting: Family Medicine

## 2018-04-30 ENCOUNTER — Ambulatory Visit (INDEPENDENT_AMBULATORY_CARE_PROVIDER_SITE_OTHER): Payer: 59 | Admitting: Family Medicine

## 2018-04-30 VITALS — BP 106/78 | HR 90 | Temp 97.9°F | Ht 75.0 in | Wt 222.0 lb

## 2018-04-30 DIAGNOSIS — Z09 Encounter for follow-up examination after completed treatment for conditions other than malignant neoplasm: Secondary | ICD-10-CM

## 2018-04-30 DIAGNOSIS — K219 Gastro-esophageal reflux disease without esophagitis: Secondary | ICD-10-CM | POA: Insufficient documentation

## 2018-04-30 DIAGNOSIS — Z716 Tobacco abuse counseling: Secondary | ICD-10-CM

## 2018-04-30 DIAGNOSIS — R05 Cough: Secondary | ICD-10-CM

## 2018-04-30 DIAGNOSIS — J4540 Moderate persistent asthma, uncomplicated: Secondary | ICD-10-CM | POA: Insufficient documentation

## 2018-04-30 DIAGNOSIS — F17200 Nicotine dependence, unspecified, uncomplicated: Secondary | ICD-10-CM

## 2018-04-30 DIAGNOSIS — R059 Cough, unspecified: Secondary | ICD-10-CM | POA: Insufficient documentation

## 2018-04-30 DIAGNOSIS — R0602 Shortness of breath: Secondary | ICD-10-CM | POA: Diagnosis not present

## 2018-04-30 DIAGNOSIS — T7840XA Allergy, unspecified, initial encounter: Secondary | ICD-10-CM

## 2018-04-30 MED ORDER — MONTELUKAST SODIUM 10 MG PO TABS: 10.0000 mg | ORAL_TABLET | Freq: Every day | ORAL | 3 refills | Status: AC

## 2018-04-30 MED ORDER — PANTOPRAZOLE SODIUM 40 MG PO TBEC: 40.0000 mg | DELAYED_RELEASE_TABLET | Freq: Two times a day (BID) | ORAL | 3 refills | Status: AC

## 2018-04-30 MED ORDER — CETIRIZINE HCL 10 MG PO TABS: 10.0000 mg | ORAL_TABLET | Freq: Every day | ORAL | 11 refills | Status: AC

## 2018-04-30 NOTE — Progress Notes (Signed)
Follow Up  Subjective:    Patient ID: Nicolas Buchanan, male    DOB: [birth year], 48 y.o.   MRN: [id number]   Chief Complaint  Patient presents with  . Follow-up    Asthma/ acid reflux  . Insomnia    HPI  Ms. Nicolas Buchanan is a 48 year old male with a past medical history of Anaphylaxis and Acid Reflux. She is here today for follow up.   Current Status: Since is last office visit, he states that he continues to have coughing chest discomfort, acid reflux at night. He takes Protonix daily as prescribed. He denies visual changes, chest pain, cough, shortness of breath, heart palpitations, and falls. He has occasionally headaches and dizziness with position changes. Denies severe headaches, confusion, seizures, double vision, and blurred vision, nausea and vomiting.  He denies fevers, chills, fatigue, recent infections, weight loss, and night sweats. No reports of GI problems such as diarrhea, and constipation. He has no reports of blood in stools, dysuria and hematuria. No depression or anxiety reported. He denies pain today.   Review of Systems  Constitutional: Negative.   HENT: Positive for dental problem (Multiple Dental Caries).   Eyes: Negative.   Respiratory: Positive for cough (occasional) and shortness of breath (Occasional).   Cardiovascular: Negative.   Gastrointestinal: Negative.   Endocrine: Negative.   Genitourinary: Negative.   Musculoskeletal: Negative.   Skin: Negative.   Allergic/Immunologic: Negative.   Neurological: Negative.   Hematological: Negative.   Psychiatric/Behavioral: Negative.    Objective:   Physical Exam Constitutional:      Appearance: Normal appearance. He is normal weight.  HENT:     Head: Normocephalic and atraumatic.     Right Ear: External ear normal.     Left Ear: External ear normal.     Nose: Nose normal.     Mouth/Throat:     Mouth: Mucous membranes are moist.     Pharynx: Oropharynx is clear.  Eyes:     Conjunctiva/sclera: Conjunctivae  normal.  Neck:     Musculoskeletal: Normal range of motion and neck supple.  Cardiovascular:     Rate and Rhythm: Normal rate and regular rhythm.     Pulses: Normal pulses.     Heart sounds: Normal heart sounds.  Pulmonary:     Effort: Pulmonary effort is normal.     Breath sounds: Normal breath sounds.  Abdominal:     General: Bowel sounds are normal.     Palpations: Abdomen is soft.  Musculoskeletal: Normal range of motion.  Skin:    General: Skin is warm and dry.     Capillary Refill: Capillary refill takes less than 2 seconds.  Neurological:     General: No focal deficit present.     Mental Status: He is alert and oriented to person, place, and time.  Psychiatric:        Mood and Affect: Mood normal.        Behavior: Behavior normal.        Thought Content: Thought content normal.        Judgment: Judgment normal.    Assessment & Plan:   1. Moderate persistent asthma, unspecified whether complicated Stable. No signs or symptoms of respiratory distress noted or reported.  - montelukast (SINGULAIR) 10 MG tablet; Take 1 tablet (10 mg total) by mouth at bedtime.  Dispense: 30 tablet; Refill: 3 - cetirizine (ZYRTEC) 10 MG tablet; Take 1 tablet (10 mg total) by mouth daily.  Dispense: 30 tablet; Refill: 11  2. Shortness of breath - montelukast (SINGULAIR) 10 MG tablet; Take 1 tablet (10 mg total) by mouth at bedtime.  Dispense: 30 tablet; Refill: 3 - cetirizine (ZYRTEC) 10 MG tablet; Take 1 tablet (10 mg total) by mouth daily.  Dispense: 30 tablet; Refill: 11  3. Cough - montelukast (SINGULAIR) 10 MG tablet; Take 1 tablet (10 mg total) by mouth at bedtime.  Dispense: 30 tablet; Refill: 3 - cetirizine (ZYRTEC) 10 MG tablet; Take 1 tablet (10 mg total) by mouth daily.  Dispense: 30 tablet; Refill: 11  4. Smoker He continues to smoke occasionally.  - montelukast (SINGULAIR) 10 MG tablet; Take 1 tablet (10 mg total) by mouth at bedtime.  Dispense: 30 tablet; Refill: 3 -  cetirizine (ZYRTEC) 10 MG tablet; Take 1 tablet (10 mg total) by mouth daily.  Dispense: 30 tablet; Refill: 11  5. Encounter for smoking cessation counseling.  Benefits of quitting smoking reviewed with patient today. He will contact office if needed.   6. Gastroesophageal reflux disease without esophagitis We will increase frequency of dose today to Protonix 40 mg BID.  - pantoprazole (PROTONIX) 40 MG tablet; Take 1 tablet (40 mg total) by mouth 2 (two) times daily.  Dispense: 60 tablet; Refill: 3  7. Allergic state, initial encounter We will initiate Singulair and Zyrtec today.  - montelukast (SINGULAIR) 10 MG tablet; Take 1 tablet (10 mg total) by mouth at bedtime.  Dispense: 30 tablet; Refill: 3 - cetirizine (ZYRTEC) 10 MG tablet; Take 1 tablet (10 mg total) by mouth daily.  Dispense: 30 tablet; Refill: 11  7. Follow up He will follow up in 3 months.   Meds ordered this encounter  Medications  . montelukast (SINGULAIR) 10 MG tablet    Sig: Take 1 tablet (10 mg total) by mouth at bedtime.    Dispense:  30 tablet    Refill:  3  . cetirizine (ZYRTEC) 10 MG tablet    Sig: Take 1 tablet (10 mg total) by mouth daily.    Dispense:  30 tablet    Refill:  11  . pantoprazole (PROTONIX) 40 MG tablet    Sig: Take 1 tablet (40 mg total) by mouth 2 (two) times daily.    Dispense:  60 tablet    Refill:  3   Nicolas IpNatalie Adonijah Baena,  MSN, [facility] HeartFNP-C [facility] [address]  [place], [place] [id number] [phone]

## 2018-04-30 NOTE — Patient Instructions (Signed)
Montelukast oral tablets What is this medicine? MONTELUKAST (mon te LOO kast) is used to prevent and treat the symptoms of asthma. It is also used to treat allergies. Do not use for an acute asthma attack. This medicine may be used for other purposes; ask your health care provider or pharmacist if you have questions. COMMON BRAND NAME(S): Singulair What should I tell my health care provider before I take this medicine? They need to know if you have any of these conditions: -liver disease -an unusual or allergic reaction to montelukast, other medicines, foods, dyes, or preservatives -pregnant or trying to get pregnant -breast-feeding How should I use this medicine? This medicine should be given by mouth. Follow the directions on the prescription label. Take this medicine at the same time every day. You may take this medicine with or without meals. Do not chew the tablets. Do not stop taking your medicine unless your doctor tells you to. Talk to your pediatrician regarding the use of this medicine in children. Special care may be needed. While this drug may be prescribed for children as young as 69 years of age for selected conditions, precautions do apply. Overdosage: If you think you have taken too much of this medicine contact a poison control center or emergency room at once. NOTE: This medicine is only for you. Do not share this medicine with others. What if I miss a dose? If you miss a dose, take it as soon as you can. If it is almost time for your next dose, take only that dose. Do not take double or extra doses. What may interact with this medicine? -anti-infectives like rifampin and rifabutin -medicines for seizures like phenytoin, phenobarbital, and carbamazepine This list may not describe all possible interactions. Give your health care provider a list of all the medicines, herbs, non-prescription drugs, or dietary supplements you use. Also tell them if you smoke, drink alcohol, or use  illegal drugs. Some items may interact with your medicine. What should I watch for while using this medicine? Visit your doctor or health care professional for regular checks on your progress. Tell your doctor or health care professional if your allergy or asthma symptoms do not improve. Take your medicine even when you do not have symptoms. Do not stop taking any of your medicine(s) unless your doctor tells you to. If you have asthma, talk to your doctor about what to do in an acute asthma attack. Always have your inhaled rescue medicine for asthma attacks with you. Patients and their families should watch for new or worsening thoughts of suicide or depression. Also watch for sudden changes in feelings such as feeling anxious, agitated, panicky, irritable, hostile, aggressive, impulsive, severely restless, overly excited and hyperactive, or not being able to sleep. Any worsening of mood or thoughts of suicide or dying should be reported to your health care professional right away. What side effects may I notice from receiving this medicine? Side effects that you should report to your doctor or health care professional as soon as possible: -allergic reactions like skin rash or hives, or swelling of the face, lips, or tongue -breathing problems -changes in emotions or moods -confusion -depressed mood -fever or infection -hallucinations -joint pain -painful lumps under the skin -pain, tingling, numbness in the hands or feet -redness, blistering, peeling, or loosening of the skin, including inside the mouth -restlessness -seizures -sleep walking -signs and symptoms of infection like fever; chills; cough; sore throat; flu-like illness -signs and symptoms of liver injury like  dark yellow or brown urine; general ill feeling or flu-like symptoms; light-colored stools; loss of appetite; nausea; right upper belly pain; unusually weak or tired; yellowing of the eyes or skin -sinus pain or  swelling -stuttering -suicidal thoughts or other mood changes -tremors -trouble sleeping -uncontrolled muscle movements -unusual bleeding or bruising -vivid or bad dreams Side effects that usually do not require medical attention (report to your doctor or health care professional if they continue or are bothersome): -dizziness -drowsiness -headache -runny nose -stomach upset -tiredness This list may not describe all possible side effects. Call your doctor for medical advice about side effects. You may report side effects to FDA at 1-800-FDA-1088. Where should I keep my medicine? Keep out of the reach of children. Store at room temperature between 15 and 30 degrees C (59 and 86 degrees F). Protect from light and moisture. Keep this medicine in the original bottle. Throw away any unused medicine after the expiration date. NOTE: This sheet is a summary. It may not cover all possible information. If you have questions about this medicine, talk to your doctor, pharmacist, or health care provider.  2019 [employer]vier/[employer] Standard (2017-11-28 13:51:04) Cetirizine tablets What is this medicine? CETIRIZINE (se TI ra zeen) is an antihistamine. This medicine is used to treat or prevent symptoms of allergies. It is also used to help reduce itchy skin rash and hives. This medicine may be used for other purposes; ask your health care provider or pharmacist if you have questions. COMMON BRAND NAME(S): All Day Allergy, Allergy Relief, Zyrtec, Zyrtec Hives Relief What should I tell my health care provider before I take this medicine? They need to know if you have any of these conditions: -kidney disease -liver disease -an unusual or allergic reaction to cetirizine, hydroxyzine, other medicines, foods, dyes, or preservatives -pregnant or trying to get pregnant -breast-feeding How should I use this medicine? Take this medicine by mouth with a glass of water. Follow the directions on the prescription  label. You can take this medicine with food or on an empty stomach. Take your medicine at regular times. Do not take more often than directed. You may need to take this medicine for several days before your symptoms improve. Talk to your pediatrician regarding the use of this medicine in children. Special care may be needed. While this drug may be prescribed for children as young as 166 years of age for selected conditions, precautions do apply. Overdosage: If you think you have taken too much of this medicine contact a poison control center or emergency room at once. NOTE: This medicine is only for you. Do not share this medicine with others. What if I miss a dose? If you miss a dose, take it as soon as you can. If it is almost time for your next dose, take only that dose. Do not take double or extra doses. What may interact with this medicine? -alcohol -certain medicines for anxiety or sleep -narcotic medicines for pain -other medicines for colds or allergies This list may not describe all possible interactions. Give your health care provider a list of all the medicines, herbs, non-prescription drugs, or dietary supplements you use. Also tell them if you smoke, drink alcohol, or use illegal drugs. Some items may interact with your medicine. What should I watch for while using this medicine? Visit your doctor or health care professional for regular checks on your health. Tell your doctor if your symptoms do not improve. You may get drowsy or dizzy. Do not drive,  use machinery, or do anything that needs mental alertness until you know how this medicine affects you. Do not stand or sit up quickly, especially if you are an older patient. This reduces the risk of dizzy or fainting spells. Your mouth may get dry. Chewing sugarless gum or sucking hard candy, and drinking plenty of water may help. Contact your doctor if the problem does not go away or is severe. What side effects may I notice from receiving  this medicine? Side effects that you should report to your doctor or health care professional as soon as possible: -allergic reactions like skin rash, itching or hives, swelling of the face, lips, or tongue -changes in vision or hearing -fast or irregular heartbeat -trouble passing urine or change in the amount of urine Side effects that usually do not require medical attention (report to your doctor or health care professional if they continue or are bothersome): -dizziness -dry mouth -irritability -sore throat -stomach pain -tiredness This list may not describe all possible side effects. Call your doctor for medical advice about side effects. You may report side effects to FDA at 1-800-FDA-1088. Where should I keep my medicine? Keep out of the reach of children. Store at room temperature between 15 and 30 degrees C (59 and 86 degrees F). Throw away any unused medicine after the expiration date. NOTE: This sheet is a summary. It may not cover all possible information. If you have questions about this medicine, talk to your doctor, pharmacist, or health care provider.  2019 Elsevier/Gold Standard (2014-04-29 13:44:42) Asthma, Adult  Asthma is a long-term (chronic) condition in which the airways get tight and narrow. The airways are the breathing passages that lead from the nose and mouth down into the lungs. A person with asthma will have times when symptoms get worse. These are called asthma attacks. They can cause coughing, whistling sounds when you breathe (wheezing), shortness of breath, and chest pain. They can make it hard to breathe. There is no cure for asthma, but medicines and lifestyle changes can help control it. There are many things that can bring on an asthma attack or make asthma symptoms worse (triggers). Common triggers include:  Mold.  Dust.  Cigarette smoke.  Cockroaches.  Things that can cause allergy symptoms (allergens). These include animal skin flakes  (dander) and pollen from trees or grass.  Things that pollute the air. These may include household cleaners, wood smoke, smog, or chemical odors.  Cold air, weather changes, and wind.  Crying or laughing hard.  Stress.  Certain medicines or drugs.  Certain foods such as dried fruit, potato chips, and grape juice.  Infections, such as a cold or the flu.  Certain medical conditions or diseases.  Exercise or tiring activities. Asthma may be treated with medicines and by staying away from the things that cause asthma attacks. Types of medicines may include:  Controller medicines. These help prevent asthma symptoms. They are usually taken every day.  Fast-acting reliever or rescue medicines. These quickly relieve asthma symptoms. They are used as needed and provide short-term relief.  Allergy medicines if your attacks are brought on by allergens.  Medicines to help control the body's defense (immune) system. Follow these instructions at home: Avoiding triggers in your home  Change your heating and air conditioning filter often.  Limit your use of fireplaces and wood stoves.  Get rid of pests (such as roaches and mice) and their droppings.  Throw away plants if you see mold on them.  Clean your floors. Dust regularly. Use cleaning products that do not smell.  Have someone vacuum when you are not home. Use a vacuum cleaner with a HEPA filter if possible.  Replace carpet with wood, tile, or vinyl flooring. Carpet can trap animal skin flakes and dust.  Use allergy-proof pillows, mattress covers, and box spring covers.  Wash bed sheets and blankets every week in hot water. Dry them in a dryer.  Keep your bedroom free of any triggers.  Avoid pets and keep windows closed when things that cause allergy symptoms are in the air.  Use blankets that are made of polyester or cotton.  Clean bathrooms and kitchens with bleach. If possible, have someone repaint the walls in these  rooms with mold-resistant paint. Keep out of the rooms that are being cleaned and painted.  Wash your hands often with soap and water. If soap and water are not available, use hand sanitizer.  Do not allow anyone to smoke in your home. General instructions  Take over-the-counter and prescription medicines only as told by your doctor. ? Talk with your doctor if you have questions about how or when to take your medicines. ? Make note if you need to use your medicines more often than usual.  Do not use any products that contain nicotine or tobacco, such as cigarettes and e-cigarettes. If you need help quitting, ask your doctor.  Stay away from secondhand smoke.  Avoid doing things outdoors when allergen counts are high and when air quality is low.  Wear a ski mask when doing outdoor activities in the winter. The mask should cover your nose and mouth. Exercise indoors on cold days if you can.  Warm up before you exercise. Take time to cool down after exercise.  Use a peak flow meter as told by your doctor. A peak flow meter is a tool that measures how well the lungs are working.  Keep track of the peak flow meter's readings. Write them down.  Follow your asthma action plan. This is a written plan for taking care of your asthma and treating your attacks.  Make sure you get all the shots (vaccines) that your doctor recommends. Ask your doctor about a flu shot and a pneumonia shot.  Keep all follow-up visits as told by your doctor. This is important. Contact a doctor if:  You have wheezing, shortness of breath, or a cough even while taking medicine to prevent attacks.  The mucus you cough up (sputum) is thicker than usual.  The mucus you cough up changes from clear or white to yellow, green, gray, or bloody.  You have problems from the medicine you are taking, such as: ? A rash. ? Itching. ? Swelling. ? Trouble breathing.  You need reliever medicines more than 2-3 times a  week.  Your peak flow reading is still at 50-79% of your personal best after following the action plan for 1 hour.  You have a fever. Get help right away if:  You seem to be worse and are not responding to medicine during an asthma attack.  You are short of breath even at rest.  You get short of breath when doing very little activity.  You have trouble eating, drinking, or talking.  You have chest pain or tightness.  You have a fast heartbeat.  Your lips or fingernails start to turn blue.  You are light-headed or dizzy, or you faint.  Your peak flow is less than 50% of your personal best.  You feel too tired to breathe normally. Summary  Asthma is a long-term (chronic) condition in which the airways get tight and narrow. An asthma attack can make it hard to breathe.  Asthma cannot be cured, but medicines and lifestyle changes can help control it.  Make sure you understand how to avoid triggers and how and when to use your medicines. This information is not intended to replace advice given to you by your health care provider. Make sure you discuss any questions you have with your health care provider. Document Released: 09/21/2007 Document Revised: 05/09/2016 Document Reviewed: 05/09/2016 Elsevier Interactive Patient Education  2019 [employer] Pantoprazole tablets What is this medicine? PANTOPRAZOLE (pan TOE pra zole) prevents the production of acid in the stomach. It is used to treat gastroesophageal reflux disease (GERD), inflammation of the esophagus, and Zollinger-Ellison syndrome. This medicine may be used for other purposes; ask your health care provider or pharmacist if you have questions. COMMON BRAND NAME(S): Protonix What should I tell my health care provider before I take this medicine? They need to know if you have any of these conditions: -liver disease -low levels of magnesium in the blood -lupus -an unusual or allergic reaction to omeprazole,  lansoprazole, pantoprazole, rabeprazole, other medicines, foods, dyes, or preservatives -pregnant or trying to get pregnant -breast-feeding How should I use this medicine? Take this medicine by mouth. Swallow the tablets whole with a drink of water. Follow the directions on the prescription label. Do not crush, break, or chew. Take your medicine at regular intervals. Do not take your medicine more often than directed. Talk to your pediatrician regarding the use of this medicine in children. While this drug may be prescribed for children as young as 5 years for selected conditions, precautions do apply. Overdosage: If you think you have taken too much of this medicine contact a poison control center or emergency room at once. NOTE: This medicine is only for you. Do not share this medicine with others. What if I miss a dose? If you miss a dose, take it as soon as you can. If it is almost time for your next dose, take only that dose. Do not take double or extra doses. What may interact with this medicine? Do not take this medicine with any of the following medications: -atazanavir -nelfinavir This medicine may also interact with the following medications: -ampicillin -delavirdine -erlotinib -iron salts -medicines for fungal infections like ketoconazole, itraconazole and voriconazole -methotrexate -mycophenolate mofetil -warfarin This list may not describe all possible interactions. Give your health care provider a list of all the medicines, herbs, non-prescription drugs, or dietary supplements you use. Also tell them if you smoke, drink alcohol, or use illegal drugs. Some items may interact with your medicine. What should I watch for while using this medicine? It can take several days before your stomach pain gets better. Check with your doctor or health care professional if your condition does not start to get better, or if it gets worse. You may need blood work done while you are taking this  medicine. This medicine may cause a decrease in vitamin B12. You should make sure that you get enough vitamin B12 while you are taking this medicine. Discuss the foods you eat and the vitamins you take with your health care professional. What side effects may I notice from receiving this medicine? Side effects that you should report to your doctor or health care professional as soon as possible: - allergic reactions like skin rash, itching or  hives, swelling of the face, lips, or tongue - bone, muscle or joint pain - breathing problems - chest pain or chest tightness - dark yellow or brown urine - dizziness - fast, irregular heartbeat - feeling faint or lightheaded - fever or sore throat - muscle spasm - palpitations - rash on cheeks or arms that gets worse in the sun - redness, blistering, peeling or loosening of the skin, including inside the mouth - seizures -stomach polyps - tremors - unusual bleeding or bruising - unusually weak or tired - yellowing of the eyes or skin Side effects that usually do not require medical attention (report to your doctor or health care professional if they continue or are bothersome): - constipation - diarrhea - dry mouth - headache - nausea This list may not describe all possible side effects. Call your doctor for medical advice about side effects. You may report side effects to FDA at 1-800-FDA-1088. Where should I keep my medicine? Keep out of the reach of children. Store at room temperature between 15 and 30 degrees C (59 and 86 degrees F). Protect from light and moisture. Throw away any unused medicine after the expiration date. NOTE: This sheet is a summary. It may not cover all possible information. If you have questions about this medicine, talk to your doctor, pharmacist, or health care provider.  2019 [employer]vier/Gold Standard (2016-11-18 13:51:59)

## 2018-05-17 ENCOUNTER — Telehealth: Payer: Self-pay

## 2018-05-17 NOTE — Telephone Encounter (Signed)
Patient was advise to call pharmacy and request a refill on his inhaler as it has 11 refills.

## 2018-05-25 ENCOUNTER — Encounter: Payer: Self-pay | Admitting: Family Medicine

## 2018-05-25 NOTE — Progress Notes (Signed)
Spoke with EMS. Patient found in basement of home. Unresponsive and in asystole. 30 minutes of cpr performed without response. Pronounced dead at the scene. Needing death certificate signed. Advised EMS to send to patient care center for signature.

## 2018-05-28 ENCOUNTER — Encounter: Payer: Self-pay | Admitting: Family Medicine

## 2018-06-17 DEATH — deceased

## 2018-07-06 NOTE — Telephone Encounter (Signed)
error 

## 2018-07-30 ENCOUNTER — Ambulatory Visit: Payer: 59 | Admitting: Family Medicine

## 2020-07-24 IMAGING — DX DG CHEST 2V
2 series · 2 of 2 positions shown · non-contrast
Comparison: 10/24/2011

CLINICAL DATA: Chest pain

EXAM:
CHEST - 2 VIEW

[chest pa]
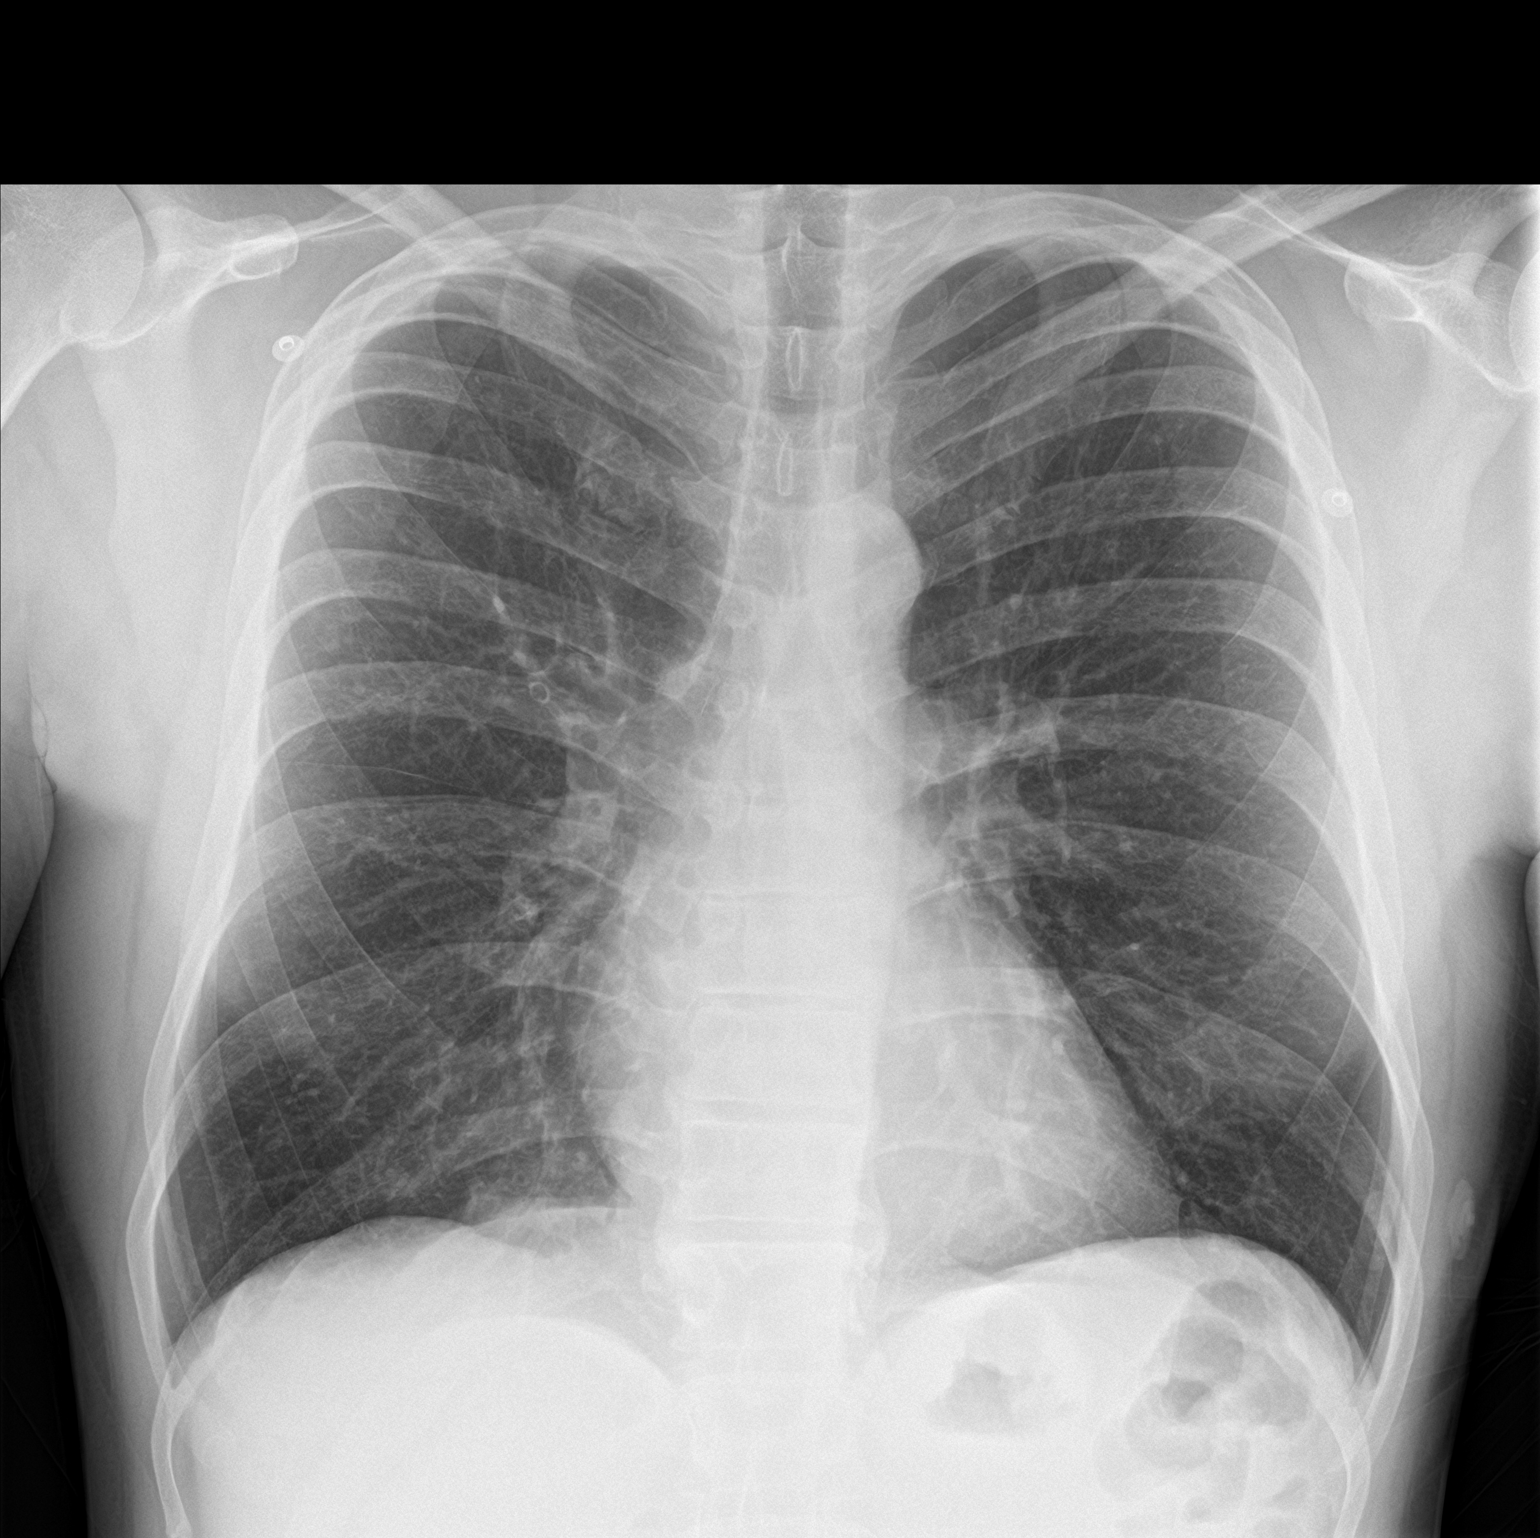

[chest lat]
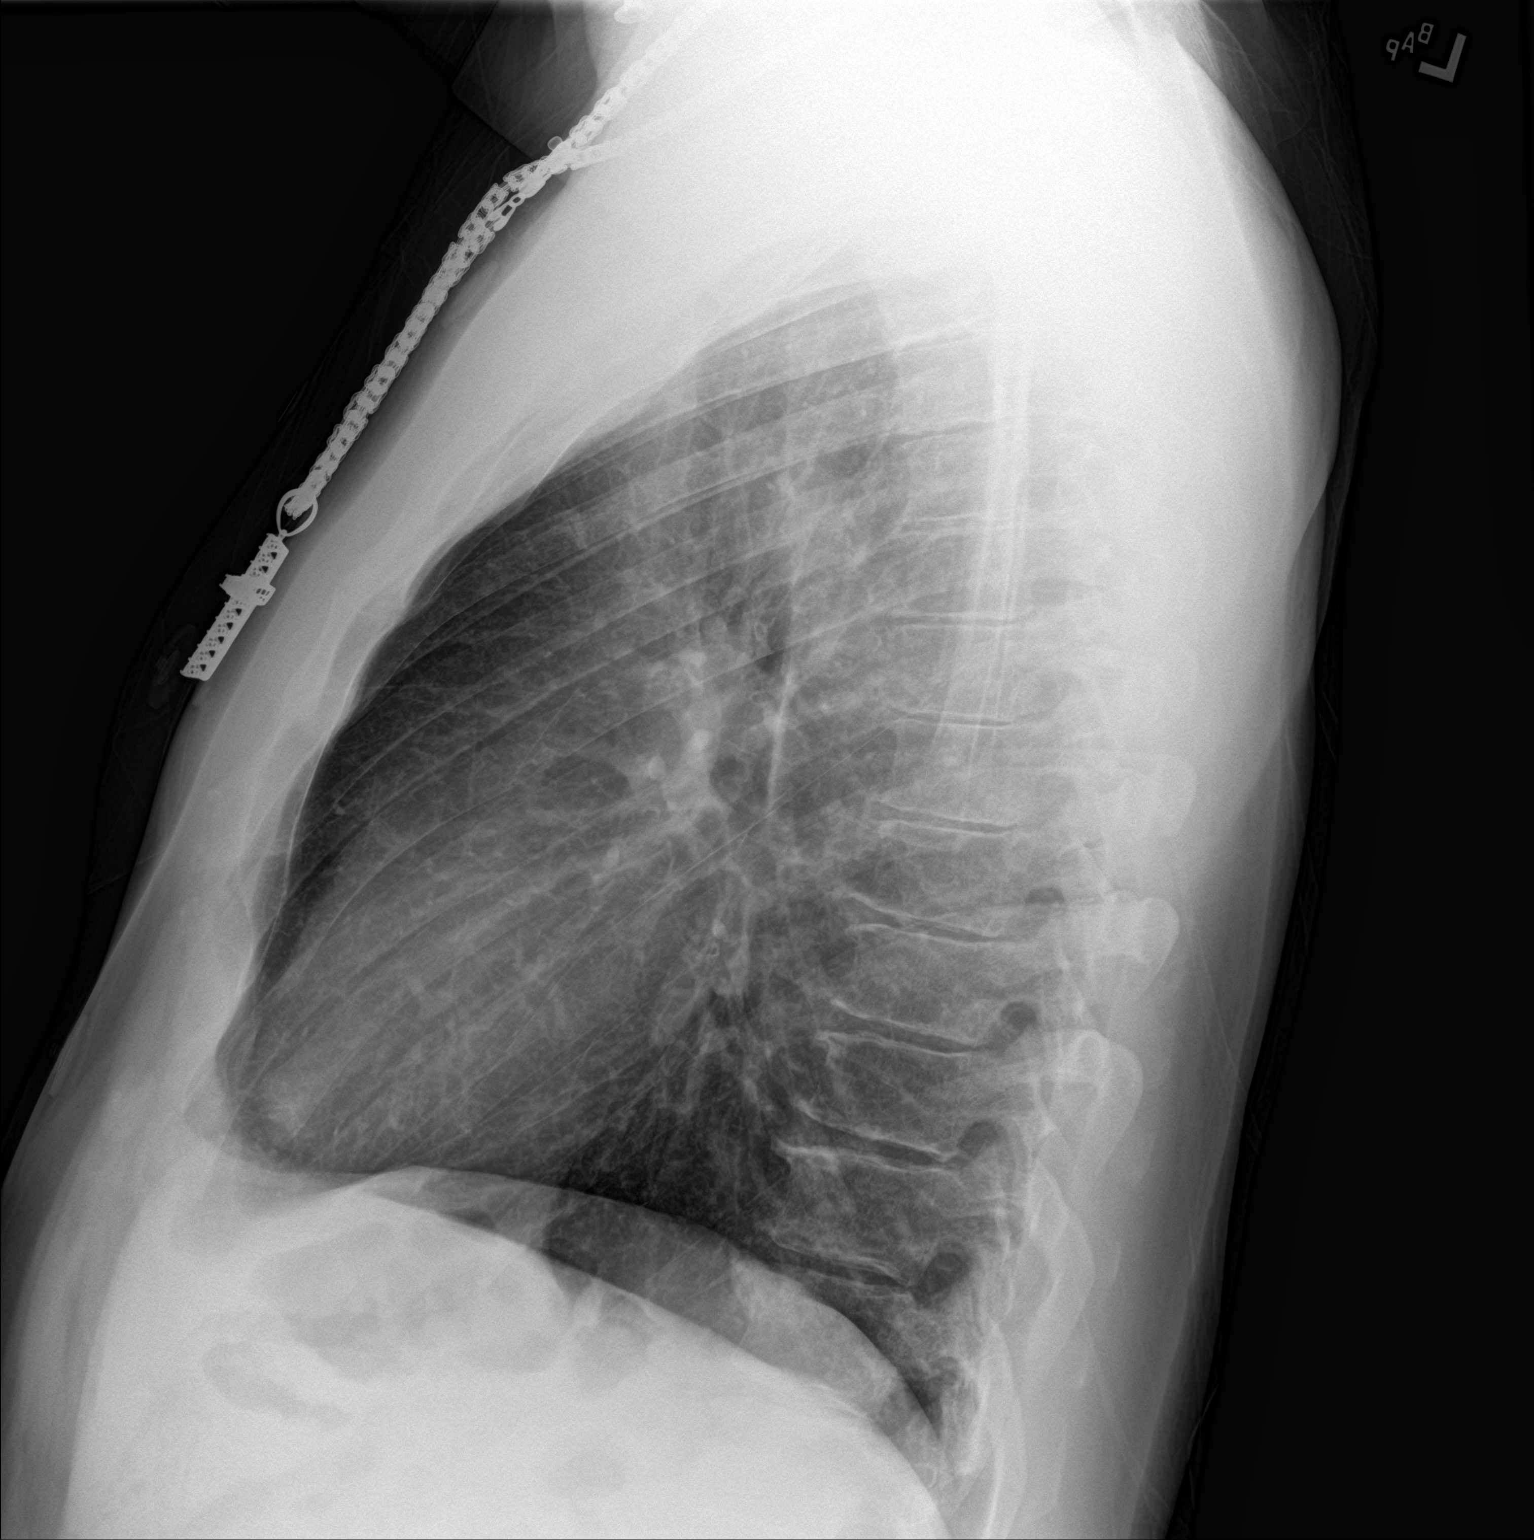

[2 of 2 positions shown; findings below may reference images not displayed]

FINDINGS: The heart size and mediastinal contours are within normal limits.
Both lungs are clear. Degenerative changes of the spine.
IMPRESSION: No active cardiopulmonary disease.

## 2020-08-03 IMAGING — CR DG CHEST 2V
2 series · 2 of 2 positions shown · non-contrast
Comparison: Chest x-ray dated 03/13/2018.

CLINICAL DATA: New onset shortness of breath this morning.

EXAM:
CHEST - 2 VIEW

[w chest pa]
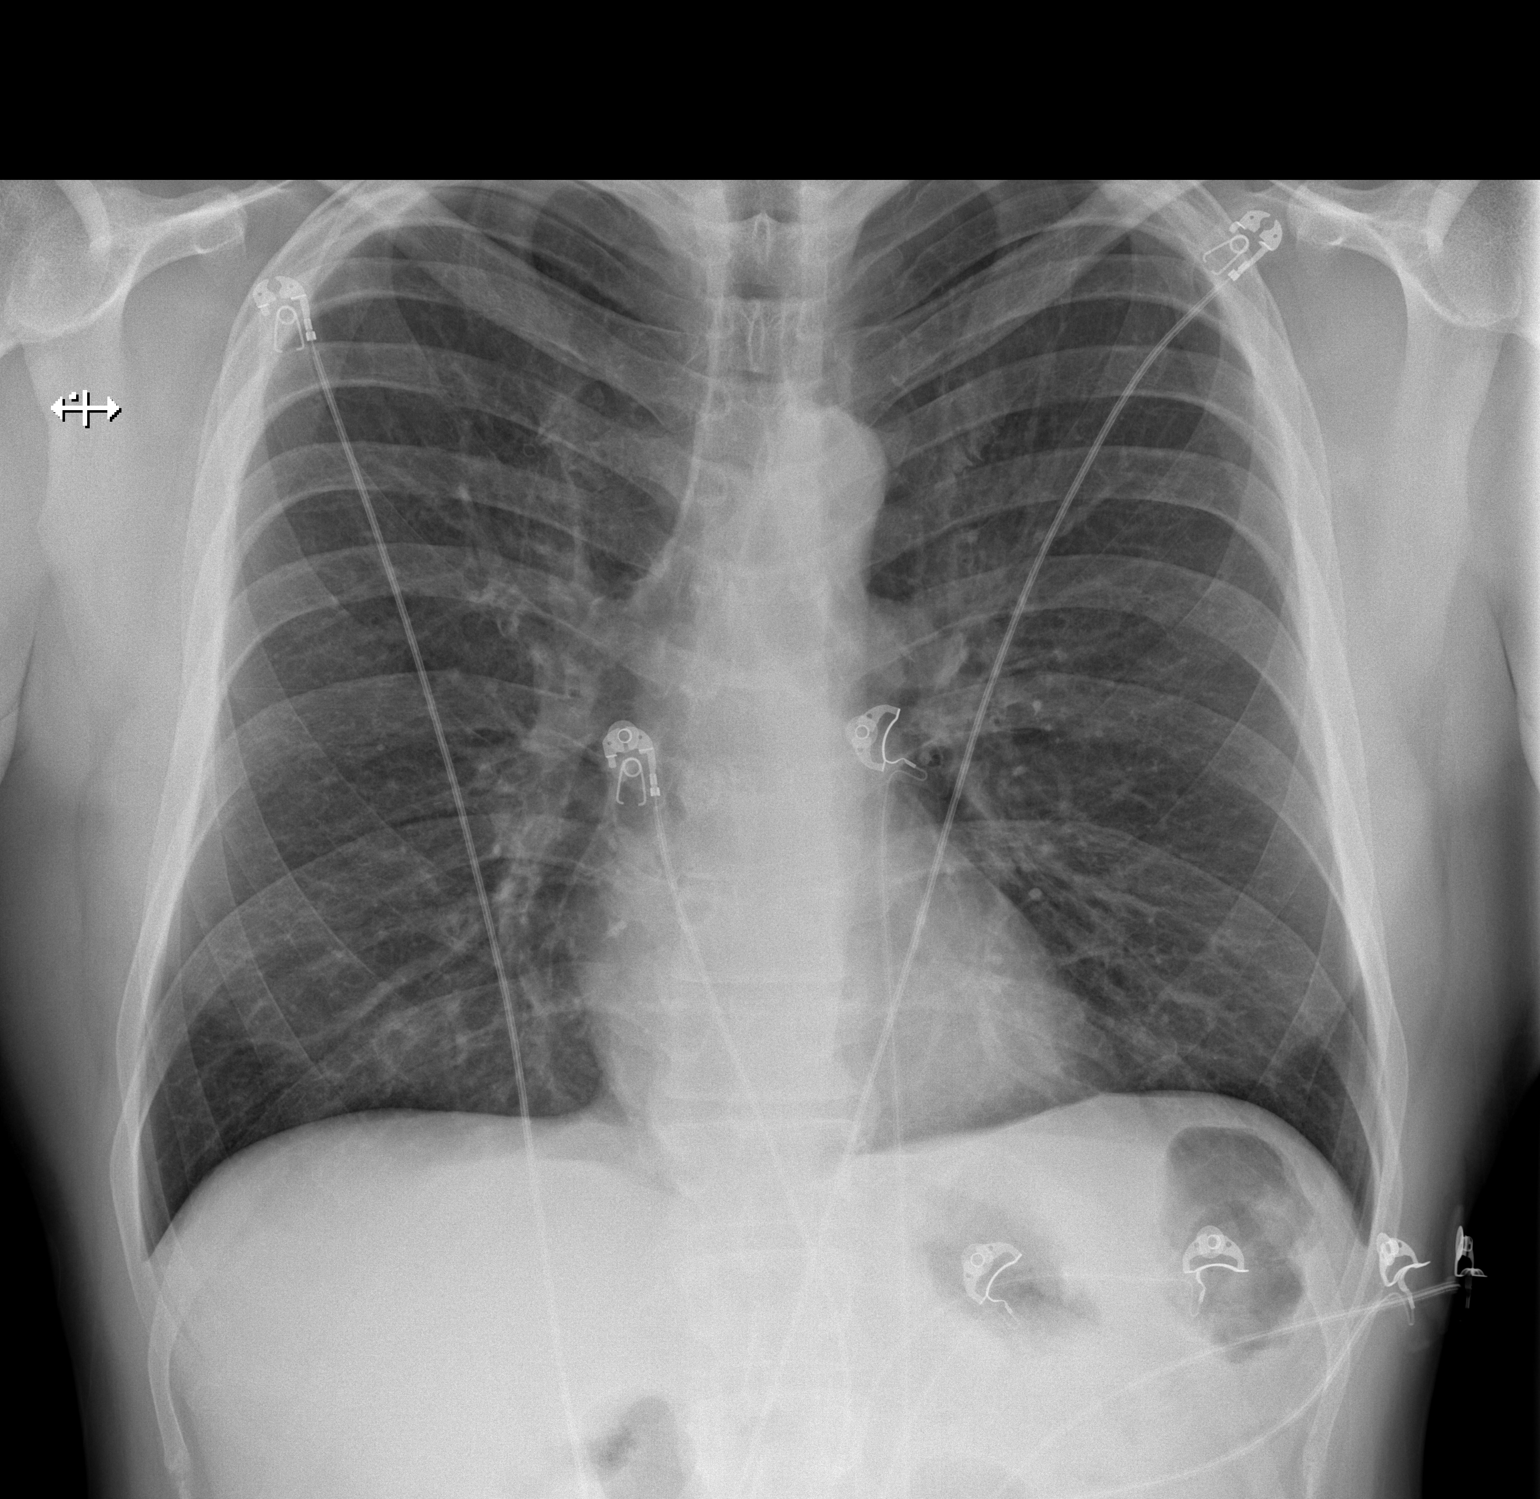

[w chest lat]
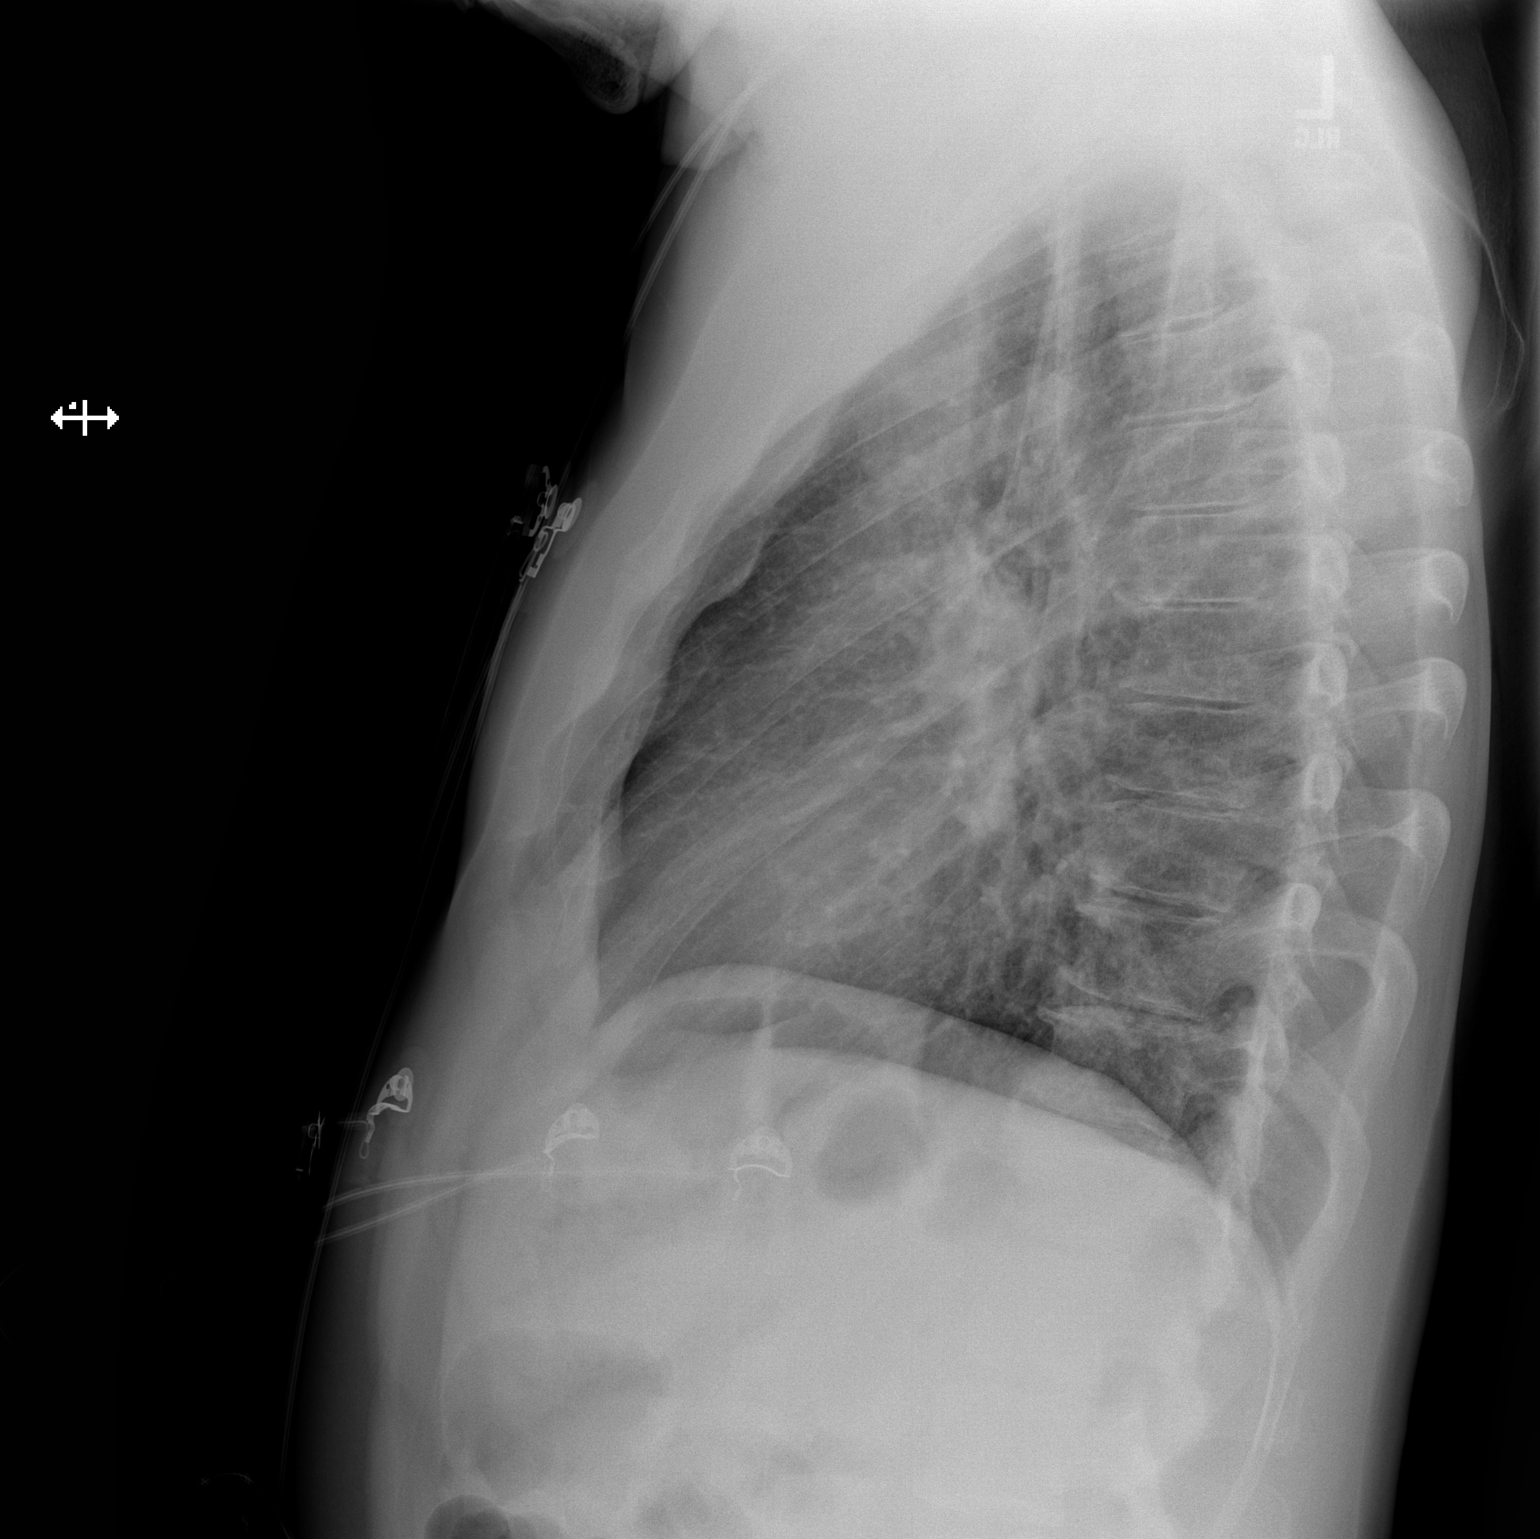

[2 of 2 positions shown; findings below may reference images not displayed]

FINDINGS: The heart size and mediastinal contours are within normal limits.
Both lungs are clear. The visualized skeletal structures are
unremarkable.
IMPRESSION: No active cardiopulmonary disease. No evidence of pneumonia or
pulmonary edema.
